# Patient Record
Sex: Male | Born: 1940 | Race: Black or African American | Hispanic: No | Marital: Married | Smoking: Light tobacco smoker
Health system: Southern US, Community
[De-identification: ages and names within clinical notes are randomized; demographics above are authoritative.]

## PROBLEM LIST (undated history)

## (undated) DIAGNOSIS — Z9289 Personal history of other medical treatment: Secondary | ICD-10-CM

## (undated) DIAGNOSIS — F32A Depression, unspecified: Secondary | ICD-10-CM

## (undated) DIAGNOSIS — F419 Anxiety disorder, unspecified: Secondary | ICD-10-CM

## (undated) DIAGNOSIS — I2510 Atherosclerotic heart disease of native coronary artery without angina pectoris: Secondary | ICD-10-CM

## (undated) DIAGNOSIS — Z8673 Personal history of transient ischemic attack (TIA), and cerebral infarction without residual deficits: Secondary | ICD-10-CM

## (undated) DIAGNOSIS — J45909 Unspecified asthma, uncomplicated: Secondary | ICD-10-CM

## (undated) DIAGNOSIS — F329 Major depressive disorder, single episode, unspecified: Secondary | ICD-10-CM

## (undated) DIAGNOSIS — I10 Essential (primary) hypertension: Secondary | ICD-10-CM

## (undated) DIAGNOSIS — I509 Heart failure, unspecified: Secondary | ICD-10-CM

## (undated) DIAGNOSIS — I219 Acute myocardial infarction, unspecified: Secondary | ICD-10-CM

## (undated) DIAGNOSIS — E785 Hyperlipidemia, unspecified: Secondary | ICD-10-CM

## (undated) DIAGNOSIS — M1990 Unspecified osteoarthritis, unspecified site: Secondary | ICD-10-CM

## (undated) HISTORY — DX: Essential (primary) hypertension: I10

## (undated) HISTORY — DX: Personal history of transient ischemic attack (TIA), and cerebral infarction without residual deficits: Z86.73

## (undated) HISTORY — DX: Personal history of other medical treatment: Z92.89

## (undated) HISTORY — DX: Unspecified osteoarthritis, unspecified site: M19.90

## (undated) HISTORY — DX: Hyperlipidemia, unspecified: E78.5

## (undated) HISTORY — DX: Heart failure, unspecified: I50.9

---

## 1998-11-09 ENCOUNTER — Emergency Department (HOSPITAL_COMMUNITY): Admission: EM | Admit: 1998-11-09 | Discharge: 1998-11-09 | Payer: Self-pay | Admitting: Emergency Medicine

## 1998-11-09 ENCOUNTER — Encounter: Payer: Self-pay | Admitting: Emergency Medicine

## 2001-11-22 ENCOUNTER — Emergency Department (HOSPITAL_COMMUNITY): Admission: EM | Admit: 2001-11-22 | Discharge: 2001-11-22 | Payer: Self-pay | Admitting: Emergency Medicine

## 2009-12-19 ENCOUNTER — Ambulatory Visit: Payer: Self-pay | Admitting: Internal Medicine

## 2009-12-19 LAB — CONVERTED CEMR LAB
ALT: 12 units/L (ref 0–53)
AST: 11 units/L (ref 0–37)
Albumin: 4.2 g/dL (ref 3.5–5.2)
Alkaline Phosphatase: 72 units/L (ref 39–117)
BUN: 11 mg/dL (ref 6–23)
CO2: 27 meq/L (ref 19–32)
Calcium: 9.6 mg/dL (ref 8.4–10.5)
Chloride: 105 meq/L (ref 96–112)
Cholesterol: 242 mg/dL — ABNORMAL HIGH (ref 0–200)
Creatinine, Ser: 0.99 mg/dL (ref 0.40–1.50)
Glucose, Bld: 90 mg/dL (ref 70–99)
HDL: 45 mg/dL (ref >39)
LDL Cholesterol: 176 mg/dL — ABNORMAL HIGH (ref 0–99)
Microalb, Ur: 0.88 mg/dL (ref 0.00–1.89)
PSA: 3.88 ng/mL (ref 0.10–4.00)
Potassium: 4.5 meq/L (ref 3.5–5.3)
Sodium: 141 meq/L (ref 135–145)
Total Bilirubin: 0.4 mg/dL (ref 0.3–1.2)
Total CHOL/HDL Ratio: 5.4
Total Protein: 6.9 g/dL (ref 6.0–8.3)
Triglycerides: 105 mg/dL (ref <150)
VLDL: 21 mg/dL (ref 0–40)

## 2010-02-13 ENCOUNTER — Ambulatory Visit: Payer: Self-pay | Admitting: Internal Medicine

## 2010-06-21 ENCOUNTER — Emergency Department (HOSPITAL_COMMUNITY)
Admission: EM | Admit: 2010-06-21 | Discharge: 2010-06-21 | Disposition: A | Discharge status: Home / Self Care | Payer: Medicare Other | Attending: Emergency Medicine | Admitting: Emergency Medicine

## 2010-06-21 DIAGNOSIS — Z79899 Other long term (current) drug therapy: Secondary | ICD-10-CM | POA: Insufficient documentation

## 2010-06-21 DIAGNOSIS — I10 Essential (primary) hypertension: Secondary | ICD-10-CM | POA: Insufficient documentation

## 2010-06-21 DIAGNOSIS — N390 Urinary tract infection, site not specified: Secondary | ICD-10-CM | POA: Insufficient documentation

## 2010-06-21 DIAGNOSIS — R319 Hematuria, unspecified: Secondary | ICD-10-CM | POA: Insufficient documentation

## 2010-06-21 LAB — URINALYSIS, ROUTINE W REFLEX MICROSCOPIC
Bilirubin Urine: NEGATIVE
Ketones, ur: NEGATIVE mg/dL
Nitrite: POSITIVE — AB
Protein, ur: 100 mg/dL — AB
Specific Gravity, Urine: 1.014 (ref 1.005–1.030)
Urine Glucose, Fasting: NEGATIVE mg/dL
Urobilinogen, UA: 1 mg/dL (ref 0.0–1.0)
pH: 6 (ref 5.0–8.0)

## 2010-06-21 LAB — POCT I-STAT, CHEM 8
BUN: 11 mg/dL (ref 6–23)
Calcium, Ion: 1.18 mmol/L (ref 1.12–1.32)
Chloride: 105 mEq/L (ref 96–112)
Creatinine, Ser: 1.3 mg/dL (ref 0.4–1.5)
Glucose, Bld: 99 mg/dL (ref 70–99)
HCT: 40 % (ref 39.0–52.0)
Hemoglobin: 13.6 g/dL (ref 13.0–17.0)
Potassium: 4.1 mEq/L (ref 3.5–5.1)
Sodium: 141 mEq/L (ref 135–145)
TCO2: 27 mmol/L (ref 0–100)

## 2010-06-21 LAB — URINE MICROSCOPIC-ADD ON

## 2010-07-14 ENCOUNTER — Ambulatory Visit (INDEPENDENT_AMBULATORY_CARE_PROVIDER_SITE_OTHER)
Admission: RE | Admit: 2010-07-14 | Discharge: 2010-07-14 | Disposition: A | Discharge status: Home / Self Care | Payer: No Typology Code available for payment source | Source: Ambulatory Visit | Attending: Internal Medicine | Admitting: Internal Medicine

## 2010-07-14 ENCOUNTER — Encounter: Payer: Self-pay | Admitting: Internal Medicine

## 2010-07-14 ENCOUNTER — Ambulatory Visit (INDEPENDENT_AMBULATORY_CARE_PROVIDER_SITE_OTHER): Payer: No Typology Code available for payment source | Admitting: Internal Medicine

## 2010-07-14 VITALS — BP 150/92 | HR 95 | Temp 97.7°F | Resp 14 | Ht 67.25 in | Wt 187.8 lb

## 2010-07-14 DIAGNOSIS — E785 Hyperlipidemia, unspecified: Secondary | ICD-10-CM

## 2010-07-14 DIAGNOSIS — R259 Unspecified abnormal involuntary movements: Secondary | ICD-10-CM

## 2010-07-14 DIAGNOSIS — M25551 Pain in right hip: Secondary | ICD-10-CM

## 2010-07-14 DIAGNOSIS — I10 Essential (primary) hypertension: Secondary | ICD-10-CM

## 2010-07-14 DIAGNOSIS — N390 Urinary tract infection, site not specified: Secondary | ICD-10-CM | POA: Insufficient documentation

## 2010-07-14 DIAGNOSIS — M25559 Pain in unspecified hip: Secondary | ICD-10-CM

## 2010-07-14 DIAGNOSIS — I2510 Atherosclerotic heart disease of native coronary artery without angina pectoris: Secondary | ICD-10-CM

## 2010-07-14 DIAGNOSIS — R251 Tremor, unspecified: Secondary | ICD-10-CM | POA: Insufficient documentation

## 2010-07-14 MED ORDER — LISINOPRIL-HYDROCHLOROTHIAZIDE 20-12.5 MG PO TABS: 1.0000 | ORAL_TABLET | Freq: Every day | ORAL | Status: DC

## 2010-07-14 MED ORDER — PRAVASTATIN SODIUM 80 MG PO TABS: 80.0000 mg | ORAL_TABLET | Freq: Every evening | ORAL | Status: DC

## 2010-07-14 NOTE — Patient Instructions (Signed)

## 2010-07-14 NOTE — Progress Notes (Signed)
Subjective:    Patient ID: Kenneth Daugherty, male    DOB: 1940/11/08, 71 y.o.   MRN: [id number]  Hypertension This is a chronic problem. The current episode started more than 1 year ago. The problem is unchanged. The problem is uncontrolled. Pertinent negatives include no anxiety, blurred vision, chest pain, headaches, malaise/fatigue, neck pain, orthopnea, palpitations, peripheral edema, PND, shortness of breath or sweats. There are no associated agents to hypertension. Risk factors for coronary artery disease include dyslipidemia. Past treatments include ACE inhibitors. The current treatment provides moderate improvement. There are no compliance problems.  There is no history of angina, kidney disease, CAD/MI, CVA, heart failure, left ventricular hypertrophy, PVD, retinopathy or a thyroid problem. There is no history of chronic renal disease.      Review of Systems  Constitutional: Negative for fever, chills, malaise/fatigue, diaphoresis, activity change, appetite change, fatigue and unexpected weight change.  HENT: Negative for neck pain and neck stiffness.   Eyes: Negative for blurred vision.  Respiratory: Negative for apnea, cough, choking, chest tightness, shortness of breath, wheezing and stridor.   Cardiovascular: Negative for chest pain, palpitations, orthopnea and PND.  Gastrointestinal: Negative for nausea, vomiting, abdominal pain, diarrhea, constipation, blood in stool and abdominal distention.  Genitourinary: Negative for dysuria, urgency, frequency, hematuria, flank pain, decreased urine volume, discharge, penile swelling, scrotal swelling and difficulty urinating (he tells me that he was treated in the ER recently for a UTI).  Musculoskeletal: Positive for arthralgias (he has had right hip pain for 6 months). Negative for myalgias, back pain, joint swelling and gait problem.  Neurological: Positive for tremors. Negative for dizziness, seizures, syncope, facial asymmetry, speech  difficulty, weakness, light-headedness, numbness and headaches.  Hematological: Negative for adenopathy. Does not bruise/bleed easily.  Psychiatric/Behavioral: Negative for suicidal ideas, hallucinations, behavioral problems, confusion, sleep disturbance, self-injury, dysphoric mood, decreased concentration and agitation. The patient is not nervous/anxious and is not hyperactive.       Objective:   Physical Exam  [nursing notereviewed. Constitutional: He appears well-developed and well-nourished. No distress.  HENT:  Head: Atraumatic.  Right Ear: External ear normal.  Left Ear: External ear normal.  Mouth/Throat: Oropharynx is clear and moist. No oropharyngeal exudate.  Eyes: Conjunctivae and EOM are normal. Pupils are equal, round, and reactive to light. Right eye exhibits no discharge. Left eye exhibits no discharge. No scleral icterus.  Neck: No thyromegaly present.  Cardiovascular: Normal rate, regular rhythm, normal heart sounds and intact distal pulses.  Exam reveals no gallop and no friction rub.   No murmur heard. Pulmonary/Chest: Effort normal. No respiratory distress. He has no wheezes. He has no rales. He exhibits no tenderness.  Abdominal: Soft. Bowel sounds are normal. He exhibits no distension. There is no tenderness. There is no rebound and no guarding.  Musculoskeletal: He exhibits no edema and no tenderness.  Neurological: He is alert. He has normal reflexes. He displays normal reflexes. No cranial nerve deficit. He exhibits normal muscle tone. Coordination normal.  Skin: Skin is warm and dry. No rash noted. He is not diaphoretic. No erythema. No pallor.  Psychiatric: He has a normal mood and affect. His behavior is normal. Judgment and thought content normal.        Lab Results  Component Value Date   HGB 13.6 06/21/2010   HCT 40.0 06/21/2010   CHOL 242* 12/19/2009   TRIG 105 12/19/2009   HDL 45 12/19/2009   ALT 12 8/41/3244   AST 11 12/19/2009   NA 141 06/21/2010  K  4.1 06/21/2010   CL 105 06/21/2010   CREATININE 1.3 06/21/2010   BUN 11 06/21/2010   CO2 27 12/19/2009   PSA 3.88 12/19/2009   MICROALBUR 0.88 12/19/2009   Lab Results  Component Value Date   CHOL 242* 12/19/2009   Lab Results  Component Value Date   HDL 45 12/19/2009   Lab Results  Component Value Date   LDLCALC 176* 12/19/2009   Lab Results  Component Value Date   TRIG 105 12/19/2009   Lab Results  Component Value Date   CHOLHDL 5.4 Ratio 12/19/2009   Assessment & Plan:

## 2010-07-15 ENCOUNTER — Encounter: Payer: Self-pay | Admitting: Internal Medicine

## 2013-02-24 ENCOUNTER — Emergency Department (HOSPITAL_COMMUNITY)
Admission: EM | Admit: 2013-02-24 | Discharge: 2013-02-25 | Disposition: A | Discharge status: Other Acute Inpatient Hospital | Payer: Medicare PPO | Source: Home / Self Care | Attending: Emergency Medicine | Admitting: Emergency Medicine

## 2013-02-24 ENCOUNTER — Encounter (HOSPITAL_COMMUNITY): Payer: Self-pay | Admitting: Emergency Medicine

## 2013-02-24 ENCOUNTER — Emergency Department (HOSPITAL_COMMUNITY): Payer: Medicare PPO

## 2013-02-24 DIAGNOSIS — N390 Urinary tract infection, site not specified: Secondary | ICD-10-CM

## 2013-02-24 DIAGNOSIS — I10 Essential (primary) hypertension: Secondary | ICD-10-CM

## 2013-02-24 DIAGNOSIS — F410 Panic disorder [episodic paroxysmal anxiety] without agoraphobia: Secondary | ICD-10-CM

## 2013-02-24 DIAGNOSIS — F329 Major depressive disorder, single episode, unspecified: Secondary | ICD-10-CM

## 2013-02-24 DIAGNOSIS — F32A Depression, unspecified: Secondary | ICD-10-CM

## 2013-02-24 DIAGNOSIS — R45851 Suicidal ideations: Secondary | ICD-10-CM

## 2013-02-24 DIAGNOSIS — F339 Major depressive disorder, recurrent, unspecified: Secondary | ICD-10-CM | POA: Diagnosis not present

## 2013-02-24 LAB — URINALYSIS, ROUTINE W REFLEX MICROSCOPIC
Bilirubin Urine: NEGATIVE
Glucose, UA: NEGATIVE mg/dL
Ketones, ur: NEGATIVE mg/dL
Nitrite: POSITIVE — AB
Specific Gravity, Urine: 1.011 (ref 1.005–1.030)
Urobilinogen, UA: 0.2 mg/dL (ref 0.0–1.0)
pH: 6 (ref 5.0–8.0)

## 2013-02-24 LAB — URINE MICROSCOPIC-ADD ON

## 2013-02-24 LAB — CBC
Hemoglobin: 14.4 g/dL (ref 13.0–17.0)
MCH: 28.6 pg (ref 26.0–34.0)
Platelets: 208 10*3/uL (ref 150–400)
RBC: 5.03 MIL/uL (ref 4.22–5.81)
WBC: 4.5 10*3/uL (ref 4.0–10.5)

## 2013-02-24 LAB — COMPREHENSIVE METABOLIC PANEL
ALT: 7 U/L (ref 0–53)
AST: 12 U/L (ref 0–37)
Albumin: 3.8 g/dL (ref 3.5–5.2)
CO2: 28 mEq/L (ref 19–32)
Calcium: 9.8 mg/dL (ref 8.4–10.5)
Chloride: 104 mEq/L (ref 96–112)
Creatinine, Ser: 0.98 mg/dL (ref 0.50–1.35)
GFR calc non Af Amer: 79 mL/min — ABNORMAL LOW (ref >90)
Potassium: 4.2 mEq/L (ref 3.5–5.1)
Sodium: 143 mEq/L (ref 135–145)
Total Bilirubin: 0.4 mg/dL (ref 0.3–1.2)
Total Protein: 7.2 g/dL (ref 6.0–8.3)

## 2013-02-24 LAB — POCT I-STAT TROPONIN I
Troponin i, poc: 0.03 ng/mL (ref 0.00–0.08)
Troponin i, poc: 0.02 ng/mL (ref 0.00–0.08)

## 2013-02-24 LAB — RAPID URINE DRUG SCREEN, HOSP PERFORMED
Amphetamines: NOT DETECTED
Barbiturates: NOT DETECTED
Benzodiazepines: NOT DETECTED
Cocaine: NOT DETECTED
Opiates: NOT DETECTED
Tetrahydrocannabinol: NOT DETECTED

## 2013-02-24 MED ORDER — NITROFURANTOIN MONOHYD MACRO 100 MG PO CAPS
100.0000 mg | ORAL_CAPSULE | Freq: Once | ORAL | Status: AC
  Administered 2013-02-24: 100 mg via ORAL
  Filled 2013-02-24: qty 1

## 2013-02-24 MED ORDER — ASPIRIN 81 MG PO CHEW
324.0000 mg | CHEWABLE_TABLET | Freq: Once | ORAL | Status: AC
  Administered 2013-02-24: 324 mg via ORAL
  Filled 2013-02-24: qty 4

## 2013-02-24 MED ORDER — NITROFURANTOIN MONOHYD MACRO 100 MG PO CAPS: 100.0000 mg | ORAL_CAPSULE | Freq: Two times a day (BID) | ORAL | Status: DC

## 2013-02-24 MED ORDER — LISINOPRIL 10 MG PO TABS: 10.0000 mg | ORAL_TABLET | Freq: Every day | ORAL | Status: DC

## 2013-02-24 MED ORDER — LISINOPRIL 10 MG PO TABS
10.0000 mg | ORAL_TABLET | Freq: Every day | ORAL | Status: DC
  Administered 2013-02-24: 10 mg via ORAL
  Filled 2013-02-24: qty 1

## 2013-02-24 NOTE — ED Notes (Signed)
Pt continues to sleep.  Awaiting transfer to [facility].

## 2013-02-24 NOTE — ED Notes (Signed)
Pt ate most of dinner.  Reports he is feeling better, laying on bed resting.  Warm blankets given.

## 2013-02-24 NOTE — BH Assessment (Signed)
Pt was asked to sign a Voluntary Admission and Consent for Treatment form as well as a Consent to Release Information form. MHT explained the purpose of the forms before Pt signed them. Pt stated that he had no one to release information to but signed the form with "none" written in the fields. Pt's nurse was notified.  -Dossie Arbour, MA  Mental Health Tech

## 2013-02-24 NOTE — BH Assessment (Signed)
[facility] Assessment Progress Note   Pt seen by tele-psychiatry by Verne Spurr, PA.  She determines that pt presents a life threatening danger to self, for which psychiatric hospitalization is indicated.  Pt accepted to [facility] to the service of Thedore Mins, MD, Rm [id number].  At 16:27 I attempted to notify EDP Dr. Elesa Massed, who was not available.  At 16:38 I spoke to [place], Charity fundraiser, the pt's nurse, and she reports that Dr Elesa Massed is already aware that the pt has been accepted.  I then gave Dorathy Daft the details noted above.  Dossie Arbour, MHT was then notified.  He agrees to have pt sign appropriate paperwork.  Doylene Canning, MA Triage Specialist 02/24/2013 @ 4:41 PM

## 2013-02-24 NOTE — BH Assessment (Signed)
Tele Assessment Note   Kenneth Daugherty is an 72 y.o. male. Writer called and spoke w/ Dr. Elesa Massed re: TTS consult. Ward states pt is currently voluntary and Ward mentions several risk factors including 72 yo male w/ SI, poor support system & guns in his home. Pt presents voluntarily to [facility] accompanied by his grandson, Kenneth Daugherty, w/ whom pt lives. Pt is cooperative and polite. Pt describes his mood as "a little depressed". He endorses poor appetite and fatigue. Pt endorses SI "once in a while" when his family and friends "aggravate me". He currently denies SI. Pt says that while his grandson is at work, relatives and friends are constantly in his house asking for $ or to borrow pt's car. Kenneth Daugherty sts that these same people who ask pt for favors never visited house prior to wife's death. Kenneth Daugherty sts that he works 15 hrs a day and while he is at work the relatives and friends "use" pt by asking for favors. Pt sts he hasn't been sleeping well and only sleeps approx. 1 hr per night. Pt sts he called EMS today because became "real hot" and nervous. Pt says he started to stagger and began shaking. Pt sts this hasn't happened to him prior to today. Pt states he had been thinking about his wife when the episode began. Pt sts he can keep himself safe if d/c. Pt sts he owns two guns which are in the house. Kenneth Daugherty says he has spoken w/ his mother and mother plans to remove pt's guns and keep them at [employer] house when she gets out of school this afternoon. Pt denies HI and [facility]. No delusions noted. Pt denies wanting to harm anyone in particular but does say he wants to harm people sometimes when "they get on my nerves". He says he is referring to the people who want to borrow his $ and his car. Pt has no hx of inpt or outpatient MH treatment. Pt denies hx of substance use or abuse. Pt says he and wife moved down to [place] from [place] 4 yrs ago.  Writer spoke w/ Dr. Elesa Massed after teleassessment and Dr. Elesa Massed would like a [facility]  extender to see pt also.   Axis I: Major Depressive NOS Axis II: Deferred Axis III:  Past Medical History  Diagnosis Date  . Hyperlipidemia   . Hypertension   . UTI (lower urinary tract infection)    Axis IV: other psychosocial or environmental problems, problems related to social environment and problems with primary support group Axis V: 41-50 serious symptoms  Past Medical History:  Past Medical History  Diagnosis Date  . Hyperlipidemia   . Hypertension   . UTI (lower urinary tract infection)     History reviewed. No pertinent past surgical history.  Family History:  Family History  Problem Relation Age of Onset  . Cancer Neg Hx   . Heart disease Neg Hx   . Hypertension Mother   . Hyperlipidemia Father     Social History:  reports that he has been smoking.  He does not have any smokeless tobacco history on file. He reports that he does not drink alcohol or use illicit drugs.  Additional Social History:  Alcohol / Drug Use Pain Medications: n/a Prescriptions: n/a Over the Counter: see PTA meds list History of alcohol / drug use?: No history of alcohol / drug abuse  CIWA: CIWA-Ar BP: 177/101 mmHg Pulse Rate: 88 COWS:    Allergies: No Known Allergies  Home Medications:  (Not in  a hospital admission)  OB/GYN Status:  No LMP for male patient.  General Assessment Data Location of Assessment: [facility] ED Is this a Tele or Face-to-Face Assessment?: Tele Assessment Is this an Initial Assessment or a Re-assessment for this encounter?: Initial Assessment Living Arrangements: Other relatives (grandson) Can pt return to current living arrangement?: Yes Admission Status: Voluntary Is patient capable of signing voluntary admission?: Yes Transfer from: Home Referral Source: Self/Family/Friend     [facility] Crisis Care Plan Living Arrangements: Other relatives (grandson)  Education Status Is patient currently in school?: No Current Grade: na Highest grade of school  patient has completed: 7  Risk to self Suicidal Ideation: Yes-Currently Present Suicidal Intent: No Is patient at risk for suicide?: Yes Suicidal Plan?: No Access to Means: Yes Specify Access to Suicidal Means: firearms in house What has been your use of drugs/alcohol within the last 12 months?: none Previous Attempts/Gestures: No How many times?: 0 Other Self Harm Risks: none Triggers for Past Attempts:  (n/a) Intentional Self Injurious Behavior: None Family Suicide History: No Recent stressful life event(s): Loss (Comment) (wife died 1 month ago, people asking pt for favors) Persecutory voices/beliefs?: No Depression: Yes Depression Symptoms: Fatigue;Insomnia Substance abuse history and/or treatment for substance abuse?: No Suicide prevention information given to non-admitted patients: Not applicable  Risk to Others Homicidal Ideation: No Thoughts of Harm to Others: No Current Homicidal Intent: No Current Homicidal Plan: No Access to Homicidal Means: No Identified Victim: na History of harm to others?: No Assessment of Violence: None Noted Violent Behavior Description: pt calm and cooperative Does patient have access to weapons?: No Criminal Charges Pending?: No Does patient have a court date: No  Psychosis Hallucinations: None noted Delusions: None noted  Mental Status Report Appear/Hygiene: Other (Comment) (appropriate) Eye Contact: Good Motor Activity: Freedom of movement Speech: Logical/coherent Level of Consciousness: Alert Mood: Depressed Affect: Appropriate to circumstance Anxiety Level: None Thought Processes: Coherent;Relevant Judgement: Unimpaired Orientation: Person;Place;Time;Situation Obsessive Compulsive Thoughts/Behaviors: None  Cognitive Functioning Concentration: Normal Memory: Recent Intact;Remote Intact IQ: Average Insight: Fair Impulse Control: Good Appetite: Poor Weight Loss:  (pt denies weight loss) Sleep: Decreased Total Hours  of Sleep: 1 Vegetative Symptoms: None  ADLScreening [facility] Assessment Services) Patient's cognitive ability adequate to safely complete daily activities?: Yes Patient able to express need for assistance with ADLs?: Yes Independently performs ADLs?: Yes (appropriate for developmental age)  Prior Inpatient Therapy Prior Inpatient Therapy: No Prior Therapy Dates: na Prior Therapy Facilty/Provider(s): na Reason for Treatment: na  Prior Outpatient Therapy Prior Outpatient Therapy: No Prior Therapy Dates: na Prior Therapy Facilty/Provider(s): na Reason for Treatment: na  ADL Screening (condition at time of admission) Patient's cognitive ability adequate to safely complete daily activities?: Yes Is the patient deaf or have difficulty hearing?: No Does the patient have difficulty seeing, even when wearing glasses/contacts?: No Does the patient have difficulty concentrating, remembering, or making decisions?: No Patient able to express need for assistance with ADLs?: Yes Does the patient have difficulty dressing or bathing?: No Independently performs ADLs?: Yes (appropriate for developmental age) Does the patient have difficulty walking or climbing stairs?: No Weakness of Legs: None Weakness of Arms/Hands: None  Home Assistive Devices/Equipment Home Assistive Devices/Equipment: Eyeglasses    Abuse/Neglect Assessment (Assessment to be complete while patient is alone) Physical Abuse: Denies Verbal Abuse: Denies Sexual Abuse: Denies Exploitation of patient/patient's resources: Denies Self-Neglect: Denies     Merchant navy officer (For Healthcare) Advance Directive: Patient does not have advance directive;Patient would not like information  Additional Information 1:1 In Past 12 Months?: No CIRT Risk: No Elopement Risk: No Does patient have medical clearance?: No     Disposition:  Disposition Initial Assessment Completed for this Encounter: Yes Disposition of Patient: Other  dispositions Other disposition(s): Other (Comment) (telepsych ordered by EDP Ward)  Lekeisha Arenas P 02/24/2013 3:02 PM

## 2013-02-24 NOTE — ED Provider Notes (Signed)
TIME SEEN: 1:01 PM  CHIEF COMPLAINT: "I just became really hot and started breathing fast"  HPI: Patient is a 72 y.o. male with a history of hypertension, hyperlipidemia, tobacco use who presents emergency department with an episode of becoming hot and hyperventilating at home today. He reports his wife passed away a month ago and he has been very upset recently over her death. He states today when he was thinking about her he became very hot, flushed and began breathing fast. He denies any chest pain, chest discomfort, shortness of breath, diaphoresis, nausea or vomiting. Currently, he states that he is feeling well but has a headache that is diffuse and throbbing. He states he had similar headaches daily. No numbness, tingling or focal weakness. No fevers, neck pain or neck stiffness. When asked about suicidality, patient reports that he has been having suicidal thoughts for the past month intermittently. None currently. No plan. He does have multiple firearms in the house. He lives with his grandson.  PCP is Dr. Yetta Barre but he states he has not seen him in over a year  ROS: See HPI Constitutional: no fever  Eyes: no drainage  ENT: no runny nose   Cardiovascular:  no chest pain  Resp: no SOB  GI: no vomiting GU: no dysuria Integumentary: no rash  Allergy: no hives  Musculoskeletal: no leg swelling  Neurological: no slurred speech ROS otherwise negative  PAST MEDICAL HISTORY/PAST SURGICAL HISTORY:  Past Medical History  Diagnosis Date  . Hyperlipidemia   . Hypertension   . UTI (lower urinary tract infection)     MEDICATIONS:  Prior to Admission medications   Medication Sig Start Date End Date Taking? Authorizing Provider  aspirin 81 MG tablet Take 81 mg by mouth as needed. For pain    Yes Historical Provider, MD    ALLERGIES:  No Known Allergies  SOCIAL HISTORY:  History  Substance Use Topics  . Smoking status: Current Every Day Smoker -- 0.50 packs/day for 50 years  .  Smokeless tobacco: Not on file  . Alcohol Use: No    FAMILY HISTORY: Family History  Problem Relation Age of Onset  . Cancer Neg Hx   . Heart disease Neg Hx   . Hypertension Mother   . Hyperlipidemia Father     EXAM: BP 175/102  Pulse 86  Temp(Src) 98.6 F (37 C) (Oral)  Resp 18  SpO2 99% CONSTITUTIONAL: Alert and oriented and responds appropriately to questions. Well-appearing; well-nourished HEAD: Normocephalic EYES: Conjunctivae clear, PERRL ENT: normal nose; no rhinorrhea; moist mucous membranes; pharynx without lesions noted NECK: Supple, no meningismus, no LAD  CARD: RRR; S1 and S2 appreciated; no murmurs, no clicks, no rubs, no gallops RESP: Normal chest excursion without splinting or tachypnea; breath sounds clear and equal bilaterally; no wheezes, no rhonchi, no rales,  ABD/GI: Normal bowel sounds; non-distended; soft, non-tender, no rebound, no guarding BACK:  The back appears normal and is non-tender to palpation, there is no CVA tenderness EXT: Normal ROM in all joints; non-tender to palpation; no edema; normal capillary refill; no cyanosis    SKIN: Normal color for age and race; warm NEURO: Moves all extremities equally; no facial droop or slurred speech PSYCH: Patient is tearful, reports intermittent suicidality, no homicidal ideation, delusions or hallucinations  MEDICAL DECISION MAKING: Patient with episode of flushing and hyperventilation and likely secondary to panic attack. Given his age and risk factors however, we'll obtain cardiac labs, chest x-ray and EKG. Will give aspirin. I do not  feel he needs any head imaging at this time for his chronic headache. Given he is an elderly male with access to firearms and reports suicidality, we'll discuss with TTS for evaluation. Patient agrees to stay in the emergency department voluntarily. His grandson at bedside agrees with this plan.  ED PROGRESS: EKG shows nonspecific ST changes. First troponin is negative. Chest  x-ray clear. Patient is still hemodynamically stable and asymptomatic. Will repeat serial troponins. Have spoke with Idalia Needle with ACT who will evaluate with telepsych and then PA will evaluate.  Family and pt updated.   Spoke with [place], PA with psych.  They will accept the patient given that he is high risk and family reports this is the worst the patient has ever been. Repeat troponin at 4 PM and 7 PM pending. Will start lisinopril for his hypertension. He states he's been off medication for 3 weeks.  Repeat troponin at 4 PM negative. Patient still well-appearing, no complaints. Agrees to admission for depression. Agrees to come in voluntarily.  Third troponin at 8 PM negative. Patient has no complaints. Will transferred to behavioral health. We'll discharge with prescription for lisinopril for his hypertension and Macrobid for his UTI. Given dose of Macrobid in the ED. Urine culture pending.   EKG Interpretation     Ventricular Rate:  86 PR Interval:  124 QRS Duration: 76 QT Interval:  335 QTC Calculation: 401 R Axis:   48 Text Interpretation:  Sinus rhythm Atrial premature complexes Probable left atrial enlargement Baseline wander in lead(s) III              Layla Maw Williamson [place], DO 02/24/13 2037

## 2013-02-24 NOTE — Consult Note (Signed)
Telepsych Consultation   Reason for Consult:  Depression with grief Referring Physician:  EDMD  Kenneth Daugherty is an 72 y.o. male.  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 Hyperlipidemia   . Hypertension   . UTI (lower urinary tract infection)    AXIS IV:  problems related to social environment and problems with primary support group AXIS V:  41-50 serious symptoms  Plan:  Recommend psychiatric Inpatient admission when medically cleared.  Subjective:   Kenneth Daugherty is a 72 y.o. male patient admitted with SOB, diaphoresis and anxiety this AM. Current testing does not reveal medical reason. Kenneth Daugherty states that he lost his wife of 45 years about a month ago after a protracted illness. He reports he has always had a history of depression and prefers to be alone. He was in special classes as a child because he had a tendency to hit others when he was angry. He dropped out in the 6th grade.      He denies any previous suicide attempts but states he will hit walls when he is depressed. He states this is the worst it has ever been. He states his anxiety is the worst it has ever been. He also endorses poor sleep, poor appetite, irritable moods, increased isolation, anhedonia with crying spells, worsening grief, poor tolerance for delayed gratification.  Kenneth Daugherty also endorses suicidal ideation with no specific plan and this too is new for him. He does have access to fire arms.       His niece is present with him and also states she too is very concerned. He did give consent for her to be present during the interview.          Kenneth Daugherty is in need of acute psychiatric hospitalization and stabilization for his MDD recurrent with complicated grief.  HPI Elements:   Location:  ED. Quality:  Chronic. Severity:  worst it has ever been. Timing:   worsening over the past 3 weeks since the death of his wife of 45 years. Duration:  years. Context:  Patient endorses suicidal ideation, poor sleep, poor appetite, and does not feel safe..  Past Psychiatric History: Past Medical History  Diagnosis Date  . Hyperlipidemia   . Hypertension   . UTI (lower urinary tract infection)     reports that he has been smoking.  He does not have any smokeless tobacco history on file. He reports that he does not drink alcohol or use illicit drugs. Family History  Problem Relation Age of Onset  . Cancer Neg Hx   . Heart disease Neg Hx   . Hypertension Mother   . Hyperlipidemia Father    Family History Substance Abuse: No Family Supports: Yes, List: (grandson and daughter) Living Arrangements: Other relatives (grandson) Can pt return to current living arrangement?: Yes Allergies:  No Known Allergies  ACT Assessment Complete:  Yes:    Educational Status    Risk to Self: Risk to self Suicidal Ideation: Yes-Currently Present Suicidal Intent: No Is patient at risk for suicide?: Yes Suicidal Plan?: No Access to Means: Yes Specify Access to Suicidal Means: firearms in house What has been your use of drugs/alcohol within the last 12 months?: none Previous Attempts/Gestures: No How many times?: 0 Other Self Harm Risks: none Triggers for Past Attempts:  (n/a) Intentional Self Injurious Behavior: None Family Suicide History: No  Recent stressful life event(s): Loss (Comment) (wife died 1 month ago, people asking pt for favors) Persecutory voices/beliefs?: No Depression: Yes Depression Symptoms: Fatigue;Insomnia Substance abuse history and/or treatment for substance abuse?: No Suicide prevention information given to non-admitted patients: Not applicable  Risk to Others: Risk to Others Homicidal Ideation: No Thoughts of Harm to Others: No Current Homicidal Intent: No Current Homicidal Plan: No Access to Homicidal Means: No Identified Victim:  na History of harm to others?: No Assessment of Violence: None Noted Violent Behavior Description: pt calm and cooperative Does patient have access to weapons?: No Criminal Charges Pending?: No Does patient have a court date: No  Abuse: Abuse/Neglect Assessment (Assessment to be complete while patient is alone) Physical Abuse: Denies Verbal Abuse: Denies Sexual Abuse: Denies Exploitation of patient/patient's resources: Denies Self-Neglect: Denies  Prior Inpatient Therapy: Prior Inpatient Therapy Prior Inpatient Therapy: No Prior Therapy Dates: na Prior Therapy Facilty/Provider(s): na Reason for Treatment: na  Prior Outpatient Therapy: Prior Outpatient Therapy Prior Outpatient Therapy: No Prior Therapy Dates: na Prior Therapy Facilty/Provider(s): na Reason for Treatment: na  Additional Information: Additional Information 1:1 In Past 12 Months?: No CIRT Risk: No Elopement Risk: No Does patient have medical clearance?: No                  Objective: Blood pressure 177/101, pulse 88, temperature 98.6 F (37 C), temperature source Oral, resp. rate 31, SpO2 97.00%.There is no weight on file to calculate BMI. Results for orders placed during the hospital encounter of 02/24/13 (from the past 72 hour(s))  CBC     Status: None   Collection Time    02/24/13 12:52 PM      Result Value Range   WBC 4.5  4.0 - 10.5 K/uL   RBC 5.03  4.22 - 5.81 MIL/uL   Hemoglobin 14.4  13.0 - 17.0 g/dL   HCT 40.9  81.1 - 91.4 %   MCV 83.9  78.0 - 100.0 fL   MCH 28.6  26.0 - 34.0 pg   MCHC 34.1  30.0 - 36.0 g/dL   RDW 78.2  95.6 - 21.3 %   Platelets 208  150 - 400 K/uL  COMPREHENSIVE METABOLIC PANEL     Status: Abnormal   Collection Time    02/24/13 12:52 PM      Result Value Range   Sodium 143  135 - 145 mEq/L   Potassium 4.2  3.5 - 5.1 mEq/L   Chloride 104  96 - 112 mEq/L   CO2 28  19 - 32 mEq/L   Glucose, Bld 108 (*) 70 - 99 mg/dL   BUN 9  6 - 23 mg/dL   Creatinine, Ser 0.86   0.50 - 1.35 mg/dL   Calcium 9.8  8.4 - 57.8 mg/dL   Total Protein 7.2  6.0 - 8.3 g/dL   Albumin 3.8  3.5 - 5.2 g/dL   AST 12  0 - 37 U/L   ALT 7  0 - 53 U/L   Alkaline Phosphatase 70  39 - 117 U/L   Total Bilirubin 0.4  0.3 - 1.2 mg/dL   GFR calc non Af Amer 79 (*) >90 mL/min   GFR calc Af Amer >90  >90 mL/min   Comment: (NOTE)     The eGFR has been calculated using the CKD EPI equation.     This calculation has not been validated in all clinical situations.     eGFR's persistently <90 mL/min signify possible Chronic Kidney  Disease.  POCT I-STAT TROPONIN I     Status: None   Collection Time    02/24/13  1:05 PM      Result Value Range   Troponin i, poc 0.00  0.00 - 0.08 ng/mL   Comment 3            Comment: Due to the release kinetics of cTnI,     a negative result within the first hours     of the onset of symptoms does not rule out     myocardial infarction with certainty.     If myocardial infarction is still suspected,     repeat the test at appropriate intervals.   Labs are reviewed and are pertinent for .  No current facility-administered medications for this encounter.   Current Outpatient Prescriptions  Medication Sig Dispense Refill  . aspirin 81 MG tablet Take 81 mg by mouth as needed. For pain         Psychiatric Specialty Exam:     Blood pressure 177/101, pulse 88, temperature 98.6 F (37 C), temperature source Oral, resp. rate 31, SpO2 97.00%.There is no weight on file to calculate BMI.  General Appearance: Fairly Groomed  Patent attorney::  Poor  Speech:  Slow  Volume:  Normal  Mood:  Depressed  Affect:  Tearful  Thought Process:  Goal Directed  Orientation:  Full (Time, Place, and Person)  Thought Content:  WDL  Suicidal Thoughts:  Yes.  without intent/plan  Homicidal Thoughts:  Yes.  without intent/plan  Memory:  NA  Judgement:  Intact  Insight:  Present  Psychomotor Activity:  Decreased  Concentration:  Poor  Recall:  Fair  Akathisia:  No   Handed:  Right  AIMS (if indicated):     Assets:  Communication Skills Desire for Improvement  Sleep:      Treatment Plan Summary: 1. Admit for crisis management and stabilization. 2. Medication management to reduce current symptoms to base line and improve the patient's overall level of functioning. 3. Treat health problems as indicated. 4. Develop treatment plan to decrease risk of relapse upon discharge and to reduce the need for readmission. 5. Psycho-social education regarding relapse prevention and self care. 6. Health care follow up as needed for medical problems. 7. Restart home medications where appropriate.  Disposition: 1. Will admit to [facility] for stabilization and crisis management. 2. Will notify MD at ED of plans for admission and orders. 3. AC will be notified of bed placement needs. 4. Thank you for allowing me to participate in the care of this very nice man.   Rona Ravens. [place] RPAC 3:59 PM 02/24/2013   Reviewed the information documented and agree with the treatment plan.  Genise Strack,[name] 02/24/2013 4:48 PM

## 2013-02-24 NOTE — ED Notes (Signed)
Pt from home by EMS for eval of anxiety.  Pt's wife passed away about 1 month ago.  Pt also hypertensive.  BP 196/118. No allergies. Takes lisinopril for HTN. CBG 99.  HR 106 ST. RR 22. 98% on room air. Lungs clear.

## 2013-02-25 ENCOUNTER — Inpatient Hospital Stay (HOSPITAL_COMMUNITY)
Admission: AD | Admit: 2013-02-25 | Discharge: 2013-02-27 | DRG: 885 | Disposition: A | Discharge status: Home / Self Care | Payer: Medicare PPO | Source: Intra-hospital | Attending: Psychiatry | Admitting: Psychiatry

## 2013-02-25 ENCOUNTER — Encounter (HOSPITAL_COMMUNITY): Payer: Self-pay | Admitting: Behavioral Health

## 2013-02-25 DIAGNOSIS — R251 Tremor, unspecified: Secondary | ICD-10-CM

## 2013-02-25 DIAGNOSIS — F339 Major depressive disorder, recurrent, unspecified: Principal | ICD-10-CM | POA: Diagnosis present

## 2013-02-25 DIAGNOSIS — Z634 Disappearance and death of family member: Secondary | ICD-10-CM

## 2013-02-25 DIAGNOSIS — Z7982 Long term (current) use of aspirin: Secondary | ICD-10-CM

## 2013-02-25 DIAGNOSIS — I10 Essential (primary) hypertension: Secondary | ICD-10-CM

## 2013-02-25 DIAGNOSIS — R45851 Suicidal ideations: Secondary | ICD-10-CM | POA: Diagnosis not present

## 2013-02-25 DIAGNOSIS — N390 Urinary tract infection, site not specified: Secondary | ICD-10-CM | POA: Diagnosis present

## 2013-02-25 DIAGNOSIS — F602 Antisocial personality disorder: Secondary | ICD-10-CM | POA: Diagnosis present

## 2013-02-25 DIAGNOSIS — F322 Major depressive disorder, single episode, severe without psychotic features: Secondary | ICD-10-CM | POA: Diagnosis present

## 2013-02-25 DIAGNOSIS — E785 Hyperlipidemia, unspecified: Secondary | ICD-10-CM

## 2013-02-25 DIAGNOSIS — M25551 Pain in right hip: Secondary | ICD-10-CM

## 2013-02-25 DIAGNOSIS — F332 Major depressive disorder, recurrent severe without psychotic features: Secondary | ICD-10-CM

## 2013-02-25 MED ORDER — FLUOXETINE HCL 10 MG PO CAPS
10.0000 mg | ORAL_CAPSULE | Freq: Every day | ORAL | Status: DC
  Administered 2013-02-25 – 2013-02-27 (×3): 10 mg via ORAL
  Filled 2013-02-25 (×3): qty 1
  Filled 2013-02-25: qty 7
  Filled 2013-02-25 (×3): qty 1

## 2013-02-25 MED ORDER — MAGNESIUM HYDROXIDE 400 MG/5ML PO SUSP: 30.0000 mL | Freq: Every day | ORAL | Status: DC | PRN

## 2013-02-25 MED ORDER — NITROFURANTOIN MONOHYD MACRO 100 MG PO CAPS
100.0000 mg | ORAL_CAPSULE | Freq: Two times a day (BID) | ORAL | Status: DC
  Administered 2013-02-25 – 2013-02-27 (×4): 100 mg via ORAL
  Filled 2013-02-25 (×9): qty 1

## 2013-02-25 MED ORDER — ALUM & MAG HYDROXIDE-SIMETH 200-200-20 MG/5ML PO SUSP: 30.0000 mL | ORAL | Status: DC | PRN

## 2013-02-25 MED ORDER — TRAZODONE HCL 50 MG PO TABS
50.0000 mg | ORAL_TABLET | Freq: Every day | ORAL | Status: DC
  Administered 2013-02-25: 50 mg via ORAL
  Filled 2013-02-25 (×2): qty 1
  Filled 2013-02-25: qty 7
  Filled 2013-02-25: qty 1

## 2013-02-25 MED ORDER — ALUM & MAG HYDROXIDE-SIMETH 200-200-20 MG/5ML PO SUSP
30.0000 mL | Freq: Once | ORAL | Status: AC
  Administered 2013-02-25: 30 mL via ORAL
  Filled 2013-02-25: qty 30

## 2013-02-25 MED ORDER — CEPHALEXIN 250 MG PO CAPS
500.0000 mg | ORAL_CAPSULE | Freq: Three times a day (TID) | ORAL | Status: DC
  Administered 2013-02-25: 500 mg via ORAL
  Filled 2013-02-25: qty 2

## 2013-02-25 MED ORDER — ACETAMINOPHEN 325 MG PO TABS: 650.0000 mg | ORAL_TABLET | Freq: Four times a day (QID) | ORAL | Status: DC | PRN

## 2013-02-25 MED ORDER — LISINOPRIL 10 MG PO TABS
10.0000 mg | ORAL_TABLET | Freq: Every day | ORAL | Status: DC
  Administered 2013-02-25 – 2013-02-27 (×3): 10 mg via ORAL
  Filled 2013-02-25 (×5): qty 1

## 2013-02-25 NOTE — Tx Team (Signed)
Initial Interdisciplinary Treatment Plan  PATIENT STRENGTHS: (choose at least two) Ability for insight Capable of independent living General fund of knowledge Motivation for treatment/growth  PATIENT STRESSORS: Loss of  relative Traumatic event   PROBLEM LIST: Problem List/Patient Goals Date to be addressed Date deferred Reason deferred Estimated date of resolution  Depression                                                       DISCHARGE CRITERIA:  Ability to meet basic life and health needs Improved stabilization in mood, thinking, and/or behavior Motivation to continue treatment in a less acute level of care  PRELIMINARY DISCHARGE PLAN: Outpatient therapy Return to previous living arrangement  PATIENT/FAMIILY INVOLVEMENT: This treatment plan has been presented to and reviewed with the patient, Kenneth Daugherty.  The patient and family have been given the opportunity to ask questions and make suggestions.  Harold Barban E 02/25/2013, 11:01 AM

## 2013-02-25 NOTE — ED Notes (Signed)
Attempted to call report they stated no RN available to take report.

## 2013-02-25 NOTE — [facility] Group Notes (Signed)
[facility] LCSW Group Therapy  02/25/2013  1:15 PM   Type of Therapy:  Group Therapy  Participation Level:  Did Not Attend  Reyes Ivan, LCSW 02/25/2013 2:31 PM

## 2013-02-25 NOTE — ED Notes (Addendum)
Spoke to [facility]  At  [facility] stated "Pt can transfer after 0700"

## 2013-02-25 NOTE — BH Assessment (Signed)
Patient's family called screaming on the phone and extremely irrate. They were demanding to know where Kenneth Daugherty was at stating, "We don't know where he is".   Sts that they know he was in the ER yesterday and have not heard from him. Writer informed family that he is not in the ED anymore. Writer explained that due to HIPPA no further details could be given regarding his dispositions, discharge, whereabouts, etc.    Family then asked if patient was here at [facility]. Writer informed family that if patient is here in this facility it could not be confirmed or denied without a code #. Writer explained to family that they must have a code #, or consent must be granted, or guardianship papers in order to provide info related to this patient. Writer tried to suggest that family leaves a msg and if patient is here he would be given the message.   Patient's (male) family member called this Clinical research associate a "BITCH" and hung up the phone.

## 2013-02-25 NOTE — [facility] Suicide Risk Assessment (Signed)
Suicide Risk Assessment  Admission Assessment     Nursing information obtained from:    Demographic factors:    Current Mental Status:    Loss Factors:    Historical Factors:    Risk Reduction Factors:     CLINICAL FACTORS:   Severe Anxiety and/or Agitation Depression:   Anhedonia Hopelessness Impulsivity Insomnia Recent sense of peace/wellbeing Severe Previous Psychiatric Diagnoses and Treatments Medical Diagnoses and Treatments/Surgeries  COGNITIVE FEATURES THAT CONTRIBUTE TO RISK:  Closed-mindedness Loss of executive function Polarized thinking Thought constriction (tunnel vision)    SUICIDE RISK:   Moderate:  Frequent suicidal ideation with limited intensity, and duration, some specificity in terms of plans, no associated intent, good self-control, limited dysphoria/symptomatology, some risk factors present, and identifiable protective factors, including available and accessible social support.  PLAN OF CARE: Patient admitted voluntarily and emergently from [facility] long emergency department for depression with suicidal ideation and unable to contract for safety. Patient will be receiving crisis stabilization, safety monitoring and medication management.  I certify that inpatient services furnished can reasonably be expected to improve the patient's condition.  [name],[name] 02/25/2013, 2:40 PM

## 2013-02-25 NOTE — Progress Notes (Signed)
Patient ID: Kenneth Daugherty, male   DOB: Feb 08, 1941, 72 y.o.   MRN: [id number] PER STATE REGULATIONS 482.30  THIS CHART WAS REVIEWED FOR MEDICAL NECESSITY WITH RESPECT TO THE PATIENT'S ADMISSION/ DURATION OF STAY.  NEXT REVIEW DATE: 03/01/2013  Willa Rough, RN, BSN CASE MANAGER

## 2013-02-25 NOTE — Progress Notes (Signed)
D: Pt mood is depressed. Pt rates his depression a 1/10. Pt has been going to groups and interacts well within the milieu.  A: Support given. Verbalization encouraged. Pt encouraged to come to nurse with any concerns. Will continue to monitor pt. R: Pt is receptive. No complaints of pain or discomfort at this time. Q15 min safety checks maintained. Will continue to monitor pt.

## 2013-02-25 NOTE — ED Provider Notes (Signed)
Noted that pt placed up for transfer to [facility] several hours ago. I called psych team at [facility] who indicates that yes, pt is accepted there and bed is ready. Nurses informed of need to transfer.  Pt resting comfortably. Nad. Pt on abx for probable uti, cx pending. Will need abx continued at bhh pending culture result.    Suzi Roots, MD 02/25/13 [phone]

## 2013-02-25 NOTE — ED Notes (Signed)
[facility] transport has been called. 

## 2013-02-25 NOTE — Progress Notes (Signed)
Patient ID: Kenneth Daugherty, male   DOB: Sep 19, 1940, 72 y.o.   MRN: [id number] Pt did not attend wrap up group at 2000. Pt remained in his room to sleep.
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
## 2013-02-25 NOTE — Progress Notes (Signed)
Admission Note  D: Patient admitted to [facility] from [facility]. During the admission, pt presented with appropriate affect and depressed mood; also tearful at times. Patient voiced that he's being feeling very depressed since his wife passed a month ago. Per report, RN was informed that the patient was SI with no plan, but does have access to firearms.   A: Support and encouragement provided to patient. Oriented patient to the unit and informed him of the unit rules/ policies of the hospital. Initiated Q15 minute checks for safety.  R: Patient receptive. He's not endorsing SI/HI or any auditory/visual hallucinations. Patient remains safe on the unit.

## 2013-02-25 NOTE — Tx Team (Signed)
Interdisciplinary Treatment Plan Update (Adult)  Date: 02/25/2013  Time Reviewed:  9:45 AM  Progress in Treatment: Attending groups: Yes Participating in groups:  Yes Taking medication as prescribed:  Yes Tolerating medication:  Yes Family/Significant othe contact made: CSW assessing  Patient understands diagnosis:  Yes Discussing patient identified problems/goals with staff:  Yes Medical problems stabilized or resolved:  Yes Denies suicidal/homicidal ideation: Yes Issues/concerns per patient self-inventory:  Yes Other:  New problem(s) identified: N/A  Discharge Plan or Barriers: CSW assessing for appropriate referrals.  Reason for Continuation of Hospitalization: Anxiety Depression Medication Stabilization  Comments: N/A  Estimated length of stay: 3-5 days  For review of initial/current patient goals, please see plan of care.  Attendees: Patient:     Family:     Physician:  Dr. Johnalagadda 02/25/2013 10:21 AM   Nursing:   Beverly Knight, RN 02/25/2013 10:21 AM   Clinical Social Worker:  Trace Wirick Horton, LCSW 02/25/2013 10:21 AM   Other: Neil Mashburn, PA 02/25/2013 10:21 AM   Other:  Maseta Dorley, MA care coordination 02/25/2013 10:21 AM   Other:  Quylle Hodnett, LCSW 02/25/2013 10:21 AM   Other:  Jennifer Clark, RN case manager 02/25/2013 10:21 AM   Other:    Other:    Other:    Other:    Other:    Other:     Scribe for Treatment Team:   Horton, Mardie Kellen Nicole, 02/25/2013 10:21 AM   

## 2013-02-26 DIAGNOSIS — F329 Major depressive disorder, single episode, unspecified: Secondary | ICD-10-CM

## 2013-02-26 DIAGNOSIS — Z634 Disappearance and death of family member: Secondary | ICD-10-CM

## 2013-02-26 LAB — URINE CULTURE

## 2013-02-26 NOTE — BHH Counselor (Signed)
Adult Comprehensive Assessment  Patient ID: Kenneth Daugherty, male   DOB: 1941-03-23, 72 y.o.   MRN: [id number]  Information Source: Information source: Patient  Current Stressors:  Educational / Learning stressors: N/A Employment / Job issues: Retired Family Relationships: N/A Surveyor, quantity / Lack of resources (include bankruptcy): N/A Housing / Lack of housing: N/A Physical health (include injuries & life threatening diseases): N/A Social relationships: N/A Substance abuse: N/A Bereavement / Loss: lost wife last month  Living/Environment/Situation:  Living Arrangements: Children Living conditions (as described by patient or guardian): Pt lives with grandson in [place].  Pt reports thatt he area isn't the best due to it being "rowdy". How long has patient lived in current situation?: 1-2 years What is atmosphere in current home: Supportive;Loving;Comfortable  Family History:  Marital status: Widowed Widowed, when?: 1 month ago Does patient have children?: Yes How many children?: 2 How is patient's relationship with their children?: Pt states that they knows 2 kids he refers to as grand kids but are not biologically his.    Childhood History:  By whom was/is the patient raised?: Both parents Additional childhood history information: Pt states that his parents were very strict.  Description of patient's relationship with caregiver when they were a child: Pt states that he got along well with his parents growing up.  Patient's description of current relationship with people who raised him/her: Parents are deceased.   Does patient have siblings?: Yes Number of Siblings: 10 Description of patient's current relationship with siblings: Pt states that he is close to one brother that lives in [place].   Did patient suffer any verbal/emotional/physical/sexual abuse as a child?: No Did patient suffer from severe childhood neglect?: No Has patient ever been sexually  abused/assaulted/raped as an adolescent or adult?: No Was the patient ever a victim of a crime or a disaster?: No Witnessed domestic violence?: No Has patient been effected by domestic violence as an adult?: No  Education:  Highest grade of school patient has completed: graduated high school Currently a Consulting civil engineer?: No Learning disability?: No  Employment/Work Situation:   Employment situation:  (retired) Patient's job has been impacted by current illness: No What is the longest time patient has a held a job?: 14 years Where was the patient employed at that time?: [employer] Has patient ever been in the [employer]?: No Has patient ever served in Buyer, retail?: No  Financial Resources:   Surveyor, quantity resources: Writer Does patient have a Lawyer or guardian?: No  Alcohol/Substance Abuse:   What has been your use of drugs/alcohol within the last 12 months?: Pt denies alcohol and drug abuse If attempted suicide, did drugs/alcohol play a role in this?: No Alcohol/Substance Abuse Treatment Hx: Denies past history If yes, describe treatment: N/A Has alcohol/substance abuse ever caused legal problems?: No  Social Support System:   Conservation officer, nature Support System: Good Describe Community Support System: Pt states that his family is supportive Type of faith/religion: Christian How does patient's faith help to cope with current illness?: prayer, church attendance  Leisure/Recreation:   Leisure and Hobbies: singing  Strengths/Needs:   What things does the patient do well?: singing In what areas does patient struggle / problems for patient: Depression, grief/loss, SI  Discharge Plan:   Does patient have access to transportation?: Yes Will patient be returning to same living situation after discharge?: Yes Currently receiving community mental health services: No If no, would patient like referral for services when discharged?: Yes (What county?) [facility] [place]) Does  patient  have financial barriers related to discharge medications?: No  Summary/Recommendations:     Patient is a 72 year old African American Male with a diagnosis of Depressive Disorder NOS.  Patient lives in [place] with his grandson.  Pt states that he was down because he lost his wife last month.  Patient will benefit from crisis stabilization, medication evaluation, group therapy and psycho education in addition to case management for discharge planning.    Horton, Salome Arnt. 02/26/2013

## 2013-02-26 NOTE — Progress Notes (Signed)
Patient ID: Kenneth Daugherty, male   DOB: 07/30/1940, 72 y.o.   MRN: [id number] D: Pt. Sitting in dayroom watching TV, reports depression at "9" of 10, "feels much better" "I guess the medicine helping" Pt. 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 A:Writer introduced self to client, reviewed medications and encouraged group. Staff will monitor q15min for safety. R: Pt. Is safe on the unit and attends group.

## 2013-02-26 NOTE — Progress Notes (Signed)
Recreation Therapy Notes  Date: 11.05.2014 Time: 3:00pm Location: [address] Dayroom  Group Topic: Communication  Goal Area(s) Addresses:  Patient will effectively communicate with peers in group.  Patient will verbalize benefit of healthy communication. Patient will verbalize positive effect of healthy communication on post d/c goals.   Behavioral Response: Did not attend.    Marykay Lex Kenneth Daugherty, LRT/CTRS  Niels Cranshaw L 02/26/2013 9:02 AM

## 2013-02-26 NOTE — BHH Group Notes (Signed)
B[facility] LCSW Group Therapy  [facility] [employer] 1:15 - 2:30 PM  02/26/2013   Type of Therapy:  Group Therapy  Participation Level:  Minimal  Participation Quality:  Attentive  Affect:  Appropriate  Cognitive:  Appropriate  Insight:  Developing/Improving   Engagement in Therapy:  Developing/Improving  Modes of Intervention:  Discussion, Education, Exploration, Problem-Solving, Rapport Building, Support   Summary of Progress/Problems:  Patient made no comments on the presentation but listened attentively to speaker from [facility].   Wynn Banker 02/26/2013

## 2013-02-26 NOTE — Progress Notes (Signed)
Morning Wellness Group  9:00 AM  The focus of this group is to educate the patient on the purpose and policies of crisis stabilization and provide a format to answer questions about their admission.  The group details unit policies and expectations of patients while admitted.  Patient participated in the warm-up stretching exercises and shared with the group a goal for the day.

## 2013-02-26 NOTE — Progress Notes (Signed)
D: Patient appropriate and cooperative with staff and peers. Patient has flat and sad affect and depressed mood. He reported on the self inventory sheet that he's sleeping well, appetite and ability to pay attention are both good and energy level is normal. Patient rated depression and feelings of hopelessness "1". He verbalized that he's feeling a lot better today, as opposed to yesterday and feels like he's going to make it outside of here.  A: Support and encouragement provided to patient. Administered scheduled medications per ordering MD. Monitor Q15 minute checks for safety.  R: Patient receptive. Denies SI/HI. Patient remains safe.

## 2013-02-26 NOTE — Progress Notes (Signed)
Adult Psychoeducational Group Note  Date:  02/26/2013 Time:  11:00am Group Topic/Focus:  Rediscovering Joy:   The focus of this group is to explore various ways to relieve stress in a positive manner.  Participation Level:  Active  Participation Quality:  Appropriate and Attentive  Affect:  Appropriate  Cognitive:  Alert and Appropriate  Insight: Appropriate  Engagement in Group:  Engaged  Modes of Intervention:  Discussion and Education  Additional Comments:  Pt attended and participated in group. Discussion was on rediscovering joy. Patient discussed ways they could bring joy back into their lives by making simple choices and setting boundaries.  Shelly Bombard D 02/26/2013, 1:00 PM

## 2013-02-26 NOTE — Progress Notes (Signed)
[facility] MD Progress Note  02/26/2013 11:22 AM Kenneth Daugherty  MRN:  [id number]  Subjective:  Patient has been compliant with the inpatient psychiatric program, actively participating in unit groups. Patient has been taking his medication as prescribed and has no reported side effects. Patient stated that he was relocated from [place] about a year ago and a primary care taker for his wife with dementia died about 2 weeks ago which his images stress trigger for his depression and suicidal ideation. Patient stated he feels safe and better today noting back he will be receiving the appropriate care and treatment here. Patient rated his depression 5/10 and anxiety 5/10 and contracts for safety. Patient stated that he felt like his wife is being get around and constantly reminding to him. He has been supported by his wife niece and grandson.  Diagnosis:   DSM5: Schizophrenia Disorders:   Obsessive-Compulsive Disorders:   Trauma-Stressor Disorders:   Substance/Addictive Disorders:   Depressive Disorders:    Axis I: Bereavement and Major Depression, single episode  ADL's:  Impaired  Sleep: Poor  Appetite:  Poor  Suicidal Ideation:  Patient has suicidal ideation but contracts for safety in the hospital Homicidal Ideation:  Denied AEB (as evidenced by):  Psychiatric Specialty Exam: ROS  Blood pressure 149/102, pulse 76, temperature 97.9 F (36.6 C), temperature source Oral, resp. rate 16, height 5' 6.5" (1.689 m), weight 77.111 kg (170 lb).Body mass index is 27.03 kg/(m^2).  General Appearance: Casual and Fairly Groomed  Eye Contact::  Minimal  Speech:  Clear and Coherent and Normal Rate  Volume:  Decreased  Mood:  Anxious and Depressed  Affect:  Depressed, Flat and Tearful  Thought Process:  Goal Directed and Intact  Orientation:  Full (Time, Place, and Person)  Thought Content:  Rumination  Suicidal Thoughts:  Yes.  without intent/plan  Homicidal Thoughts:  No  Memory:  Immediate;    Fair  Judgement:  Impaired  Insight:  Lacking  Psychomotor Activity:  Psychomotor Retardation and Restlessness  Concentration:  Fair  Recall:  Fair  Akathisia:  NA  Handed:  Right  AIMS (if indicated):     Assets:  Communication Skills Desire for Improvement Physical Health Resilience Social Support Transportation  Sleep:  Number of Hours: 6.75   Current Medications: Current Facility-Administered Medications  Medication Dose Route Frequency Provider Last Rate Last Dose  . acetaminophen (TYLENOL) tablet 650 mg  650 mg Oral Q6H PRN Verne Spurr, PA-C      . alum & mag hydroxide-simeth (MAALOX/MYLANTA) 200-200-20 MG/5ML suspension 30 mL  30 mL Oral Q4H PRN Verne Spurr, PA-C      . FLUoxetine (PROZAC) capsule 10 mg  10 mg Oral Daily Verne Spurr, PA-C   10 mg at 02/26/13 4098  . lisinopril (PRINIVIL,ZESTRIL) tablet 10 mg  10 mg Oral Daily Verne Spurr, PA-C   10 mg at 02/26/13 1191  . magnesium hydroxide (MILK OF MAGNESIA) suspension 30 mL  30 mL Oral Daily PRN Verne Spurr, PA-C      . nitrofurantoin (macrocrystal-monohydrate) (MACROBID) capsule 100 mg  100 mg Oral Q12H Verne Spurr, PA-C   100 mg at 02/26/13 4782  . traZODone (DESYREL) tablet 50 mg  50 mg Oral QHS Verne Spurr, PA-C   50 mg at 02/25/13 2107    Lab Results:  Results for orders placed during the hospital encounter of 02/24/13 (from the past 48 hour(s))  CBC     Status: None   Collection Time    02/24/13 12:52 PM  Result Value Range   WBC 4.5  4.0 - 10.5 K/uL   RBC 5.03  4.22 - 5.81 MIL/uL   Hemoglobin 14.4  13.0 - 17.0 g/dL   HCT 09.8  11.9 - 14.7 %   MCV 83.9  78.0 - 100.0 fL   MCH 28.6  26.0 - 34.0 pg   MCHC 34.1  30.0 - 36.0 g/dL   RDW 82.9  56.2 - 13.0 %   Platelets 208  150 - 400 K/uL  COMPREHENSIVE METABOLIC PANEL     Status: Abnormal   Collection Time    02/24/13 12:52 PM      Result Value Range   Sodium 143  135 - 145 mEq/L   Potassium 4.2  3.5 - 5.1 mEq/L   Chloride 104  96 - 112  mEq/L   CO2 28  19 - 32 mEq/L   Glucose, Bld 108 (*) 70 - 99 mg/dL   BUN 9  6 - 23 mg/dL   Creatinine, Ser 8.65  0.50 - 1.35 mg/dL   Calcium 9.8  8.4 - 78.4 mg/dL   Total Protein 7.2  6.0 - 8.3 g/dL   Albumin 3.8  3.5 - 5.2 g/dL   AST 12  0 - 37 U/L   ALT 7  0 - 53 U/L   Alkaline Phosphatase 70  39 - 117 U/L   Total Bilirubin 0.4  0.3 - 1.2 mg/dL   GFR calc non Af Amer 79 (*) >90 mL/min   GFR calc Af Amer >90  >90 mL/min   Comment: (NOTE)     The eGFR has been calculated using the CKD EPI equation.     This calculation has not been validated in all clinical situations.     eGFR's persistently <90 mL/min signify possible Chronic Kidney     Disease.  POCT I-STAT TROPONIN I     Status: None   Collection Time    02/24/13  1:05 PM      Result Value Range   Troponin i, poc 0.00  0.00 - 0.08 ng/mL   Comment 3            Comment: Due to the release kinetics of cTnI,     a negative result within the first hours     of the onset of symptoms does not rule out     myocardial infarction with certainty.     If myocardial infarction is still suspected,     repeat the test at appropriate intervals.  URINALYSIS, ROUTINE W REFLEX MICROSCOPIC     Status: Abnormal   Collection Time    02/24/13  3:39 PM      Result Value Range   Color, Urine YELLOW  YELLOW   APPearance CLOUDY (*) CLEAR   Specific Gravity, Urine 1.011  1.005 - 1.030   pH 6.0  5.0 - 8.0   Glucose, UA NEGATIVE  NEGATIVE mg/dL   Hgb urine dipstick TRACE (*) NEGATIVE   Bilirubin Urine NEGATIVE  NEGATIVE   Ketones, ur NEGATIVE  NEGATIVE mg/dL   Protein, ur NEGATIVE  NEGATIVE mg/dL   Urobilinogen, UA 0.2  0.0 - 1.0 mg/dL   Nitrite POSITIVE (*) NEGATIVE   Leukocytes, UA LARGE (*) NEGATIVE  URINE RAPID DRUG SCREEN (HOSP PERFORMED)     Status: None   Collection Time    02/24/13  3:39 PM      Result Value Range   Opiates NONE DETECTED  NONE DETECTED   Cocaine NONE DETECTED  NONE DETECTED   Benzodiazepines NONE DETECTED  NONE  DETECTED   Amphetamines NONE DETECTED  NONE DETECTED   Tetrahydrocannabinol NONE DETECTED  NONE DETECTED   Barbiturates NONE DETECTED  NONE DETECTED   Comment:            DRUG SCREEN FOR MEDICAL PURPOSES     ONLY.  IF CONFIRMATION IS NEEDED     FOR ANY PURPOSE, NOTIFY LAB     WITHIN 5 DAYS.                LOWEST DETECTABLE LIMITS     FOR URINE DRUG SCREEN     Drug Class       Cutoff (ng/mL)     Amphetamine      1000     Barbiturate      200     Benzodiazepine   200     Tricyclics       300     Opiates          300     Cocaine          300     THC              50  URINE MICROSCOPIC-ADD ON     Status: Abnormal   Collection Time    02/24/13  3:39 PM      Result Value Range   Squamous Epithelial / LPF RARE  RARE   WBC, UA 21-50  <3 WBC/hpf   RBC / HPF 0-2  <3 RBC/hpf   Bacteria, UA MANY (*) RARE   Casts HYALINE CASTS (*) NEGATIVE   Urine-Other MUCOUS PRESENT    URINE CULTURE     Status: None   Collection Time    02/24/13  3:39 PM      Result Value Range   Specimen Description URINE, CLEAN CATCH     Special Requests NONE     Culture  Setup Time       Value: 02/24/2013 20:56     Performed at [facility] Count       Value: >=100,000 COLONIES/ML     Performed at [facility]   Culture       Value: ESCHERICHIA COLI     GRAM NEGATIVE RODS     Performed at [facility]   Report Status PENDING    POCT I-STAT TROPONIN I     Status: None   Collection Time    02/24/13  4:26 PM      Result Value Range   Troponin i, poc 0.03  0.00 - 0.08 ng/mL   Comment 3            Comment: Due to the release kinetics of cTnI,     a negative result within the first hours     of the onset of symptoms does not rule out     myocardial infarction with certainty.     If myocardial infarction is still suspected,     repeat the test at appropriate intervals.  POCT I-STAT TROPONIN I     Status: None   Collection Time    02/24/13  8:06 PM      Result Value Range    Troponin i, poc 0.02  0.00 - 0.08 ng/mL   Comment 3            Comment: Due to the release kinetics of cTnI,     a negative result  within the first hours     of the onset of symptoms does not rule out     myocardial infarction with certainty.     If myocardial infarction is still suspected,     repeat the test at appropriate intervals.  ETHANOL     Status: None   Collection Time    02/24/13  8:49 PM      Result Value Range   Alcohol, Ethyl (B) <11  0 - 11 mg/dL   Comment:            LOWEST DETECTABLE LIMIT FOR     SERUM ALCOHOL IS 11 mg/dL     FOR MEDICAL PURPOSES ONLY    Physical Findings: AIMS:  , ,  ,  ,    CIWA:    COWS:     Treatment Plan Summary: Daily contact with patient to assess and evaluate symptoms and progress in treatment Medication management  Plan: Treatment Plan/Recommendations:   1. Admit for crisis management and stabilization. 2. Medication management to reduce current symptoms to base line and improve the patient's overall level of functioning. Continue current medications without changes. 3. Treat health problems as indicated. 4. Develop treatment plan to decrease risk of relapse upon discharge and to reduce the need for readmission. 5. Psycho-social education regarding relapse prevention and self care. 6. Health care follow up as needed for medical problems. 7. Restart home medications where appropriate. 8. Disposition plan is in progress may be discharged in 3 days if he continued to show progress clinically and contracts for safety  Medical Decision Making Problem Points:  New problem, with no additional work-up planned (3), Review of last therapy session (1) and Review of psycho-social stressors (1) Data Points:  Review or order clinical lab tests (1) Review or order medicine tests (1) Review of medication regiment & side effects (2) Review of new medications or change in dosage (2)  I certify that inpatient services furnished can reasonably  be expected to improve the patient's condition.   Cephus Tupy,[name] 02/26/2013, 11:22 AM

## 2013-02-27 DIAGNOSIS — F322 Major depressive disorder, single episode, severe without psychotic features: Secondary | ICD-10-CM | POA: Diagnosis present

## 2013-02-27 DIAGNOSIS — F4321 Adjustment disorder with depressed mood: Secondary | ICD-10-CM

## 2013-02-27 DIAGNOSIS — Z634 Disappearance and death of family member: Secondary | ICD-10-CM

## 2013-02-27 MED ORDER — LISINOPRIL 10 MG PO TABS: ORAL_TABLET | ORAL | Status: DC

## 2013-02-27 MED ORDER — NITROFURANTOIN MONOHYD MACRO 100 MG PO CAPS: ORAL_CAPSULE | ORAL | Status: DC

## 2013-02-27 MED ORDER — TRAZODONE HCL 50 MG PO TABS: ORAL_TABLET | ORAL | Status: DC

## 2013-02-27 MED ORDER — FLUOXETINE HCL 10 MG PO CAPS: ORAL_CAPSULE | ORAL | Status: DC

## 2013-02-27 NOTE — Tx Team (Signed)
Interdisciplinary Treatment Plan Update (Adult)  Date: 02/27/2013  Time Reviewed:  9:45 AM  Progress in Treatment: Attending groups: Yes Participating in groups:  Yes Taking medication as prescribed:  Yes Tolerating medication:  Yes Family/Significant othe contact made: No Patient understands diagnosis:  Yes Discussing patient identified problems/goals with staff:  Yes Medical problems stabilized or resolved:  Yes Denies suicidal/homicidal ideation: Yes Issues/concerns per patient self-inventory:  Yes Other:  New problem(s) identified: N/A  Discharge Plan or Barriers: Pt will follow up at [facility] for medication management and therapy  Reason for Continuation of Hospitalization: Stable to d/c today  Comments: N/A  Estimated length of stay: D/C today  For review of initial/current patient goals, please see plan of care.  Attendees: Patient:  Kenneth Daugherty 02/27/2013 10:45 AM   Family:     Physician:  Dr. Javier Glazier 02/27/2013 10:45 AM   Nursing:   Lowella Grip, RN 02/27/2013 10:45 AM   Clinical Social Worker:  Reyes Ivan, LCSW 02/27/2013 10:45 AM   Other: Verne Spurr, PA 02/27/2013 10:45 AM   Other:  Frankey Shown, MA care coordination 02/27/2013 10:45 AM   Other:  Juline Patch, LCSW 02/27/2013 10:45 AM   Other:  Nestor Ramp, RN 02/27/2013 10:46 AM   Other:    Other:    Other:    Other:    Other:      Scribe for Treatment Team:   Carmina Miller, 02/27/2013 , 10:45 AM

## 2013-02-27 NOTE — [facility] Suicide Risk Assessment (Signed)
[facility] Adult Inpatient Family/Significant Other Suicide Prevention Education  Suicide Prevention Education:   Patient Refusal for Family/Significant Other Suicide Prevention Education: The patient has refused to provide written consent for family/significant other to be provided Family/Significant Other Suicide Prevention Education during admission and/or prior to discharge.  Physician notified.  CSW provided suicide prevention information with patient.    The suicide prevention education provided includes the following:  Suicide risk factors  Suicide prevention and interventions  National Suicide Hotline telephone number  [facility] assessment telephone number  [facility] Emergency Assistance 911  [facility] and/or Residential Mobile Crisis Unit telephone number  Pt didn't refused but had not contact information for his grandson.  Reyes Ivan, LCSW 02/27/2013 10:34 AM

## 2013-02-27 NOTE — Progress Notes (Addendum)
Discharge Note: Discharge instructions/prescriptions/medication samples given to patient. Patient verbalized understanding of discharge instructions and prescriptions. Returned belongings to patient. Denies SI/HI/AVH. Patient d/c without incident to the lobby and transported home by his nephew.

## 2013-02-27 NOTE — [facility] Group Notes (Signed)
[facility] LCSW Aftercare Discharge Planning Group Note   02/27/2013 12:46 PM    Participation Quality:  Appropraite  Mood/Affect:  Appropriate  Depression Rating:  1  Anxiety Rating:  1  Thoughts of Suicide:  No  Will you contract for safety?   NA  Current AVH:  No  Plan for Discharge/Comments:  Patient attending discharge planning group and actively participated in group.  Patient reports doing well.  He is working with his CSW on discharge plans/follow up.  CSW provided all participants with daily workbook.   Transportation Means: Patient has transportation.   Supports:  Patient has a support system.   Kenneth Daugherty, Kenneth Daugherty

## 2013-02-27 NOTE — [facility] Group Notes (Signed)
[facility] LCSW Group Therapy  Feelings Around Relapse 1:15 -2:30        02/27/2013  3:21 PM   Type of Therapy:  Group Therapy  Participation Level:  Appropriate  Participation Quality:  Appropriate  Affect:  Appropriate  Cognitive:  Attentive Appropriate  Insight:  Developing/Improving  Engagement in Therapy: Developing/Improving  Modes of Intervention:  Discussion Exploration Problem-Solving Supportive  Summary of Progress/Problems:  The topic for today was feelings around relapse.    Patient processed feelings toward relapse and was able to relate to peers. Patient shared relapse for him would be not attending church.  He advised he had to stop church attendance during his wife's illness.  He is looking forward to singing in the choir again.  Patient identified coping skills that can be used to prevent a relapse.   Wynn Banker 02/27/2013 3:21 PM

## 2013-02-27 NOTE — Progress Notes (Addendum)
[facility] Adult Case Management Discharge Plan :  Will you be returning to the same living situation after discharge: Yes,  returning to own home At discharge, do you have transportation home?:Yes,  access to transportation Do you have the ability to pay for your medications:Yes,  access to meds  Release of information consent forms completed and in the chart;  Patient's signature needed at discharge.  Patient to Follow up at: Follow-up Information   Follow up with [facility] On 03/03/2013. (Walk in on this date for hospital discharge appointment. Walk in clinic is Monday - Friday 8 am - 3 pm. They will then schedule you for medication management and therapy. )    Contact information:   [address], [place] [address] Phone: [phone] Fax: [phone]      Patient denies SI/HI:   Yes,  denies SI/HI    Safety Planning and Suicide Prevention discussed:  Yes,  discussed with pt, unable to reach pt's grandson.  See suicide prevention education note.   CSW provided pt with resources in the community for additional support, Hospice for grief/loss counseling, [employer] and Senior Resources.    Carmina Miller 02/27/2013, 10:52 AM

## 2013-02-27 NOTE — Progress Notes (Signed)
Adult Psychoeducational Group Note  Date:  02/27/2013 Time:  1:35 PM  Group Topic/Focus:  Early Warning Signs:   The focus of this group is to help patients identify signs or symptoms they exhibit before slipping into an unhealthy state or crisis.  Participation Level:  Active  Participation Quality:  Appropriate and Attentive  Affect:  Appropriate  Cognitive:  Alert and Appropriate  Insight: Good  Engagement in Group:  Engaged  Modes of Intervention:  Activity, Discussion, Exploration, Socialization and Support  Additional Comments:  Pt came to group and shared that sadness and neglecting things he used to care about are two of his early warning signs. Pt plans on using listening to music as a coping skill to prevent relapse.   Cathlean Cower 02/27/2013, 1:35 PM

## 2013-02-27 NOTE — Discharge Summary (Signed)
Physician Discharge Summary Note  Patient:  Kenneth Daugherty is an 72 y.o., male MRN:  [id number] DOB:  [birth year] Patient phone:  [phone] (home)  Patient address:   [address] [place] [address],   Date of Admission:  02/25/2013 Date of Discharge: 02/27/2013   Reason for Admission:  MDD with complicated grief  Discharge Diagnoses: Active Problems:   * No active hospital problems. *  ROS  DSM5: Schizophrenia Disorders:  Obsessive-Compulsive Disorders:  Trauma-Stressor Disorders:  Substance/Addictive Disorders:  Depressive Disorders: Major Depressive Disorder - Severe (296.23)  AXIS I: MDD severe recurrent, complicated grief  AXIS II: Antisocial personality disorder  AXIS III:  Past Medical History   Diagnosis  Date   .  Hyperlipidemia    .  Hypertension    .  UTI (lower urinary tract infection)    AXIS IV: other psychosocial or environmental problems  AXIS V: 41-50 serious symptoms  Level of Care:  OP  Hospital Course:       Kenneth Daugherty isa 72 year old AA male reports that he has a history of depression which he reports has been worsening since his wife died about 3 weeks ago. He has stopped taking all of his medications. He endorses suicidal thoughts with no plan and no previous attempts, but states he does have firearms in his home.  They were married for 48 years.  His labs were unremarkable with the exception of his urine which indicated a UTI.      When he arrived on the unit he was evaluated and his symptoms were identified as poor appetite, poor sleep, increased mood changes with irritability, crying spells, isolation and feeling lost. Medication management was initiated with Prozac at 10mg  po qd for his depression and Macrobid 100mg  po BID for his UTI.  He was encouraged to participate in unit programming and did so.  He was pleasant and cooperative. Kenneth Daugherty seemed to enjoy the company of being in groups.     He reported each day that he felt better  and was grateful for being in the hospital. His sleep improved with Trazodone. Kenneth Daugherty mood and affect were more positive and showed a good response to the medication.     By the day of discharge he was in much improved condition than upon arrival and denied SI/HI or AVH. He noted that his appetite, sleep, and mood had all improved. He was motivated to continue taking his medication and agreed to follow up as noted below. Several recommendations were made for him in regards to follow up. Hospice was recommended for grief support for the loss of his wife, and [facility] and [facility] were recommended for his need for socialization.   Consults:  None  Significant Diagnostic Studies:  labs: CBC, CMP, UA, C&S, UDS  Discharge Vitals:   Blood pressure 153/85, pulse 83, temperature 97.8 F (36.6 C), temperature source Oral, resp. rate 16, height 5' 6.5" (1.689 m), weight 77.111 kg (170 lb). Body mass index is 27.03 kg/(m^2). Lab Results:   Results for orders placed during the hospital encounter of 02/24/13 (from the past 72 hour(s))  CBC     Status: None   Collection Time    02/24/13 12:52 PM      Result Value Range   WBC 4.5  4.0 - 10.5 K/uL   RBC 5.03  4.22 - 5.81 MIL/uL   Hemoglobin 14.4  13.0 - 17.0 g/dL   HCT 46.9  62.9 - 52.8 %   MCV  83.9  78.0 - 100.0 fL   MCH 28.6  26.0 - 34.0 pg   MCHC 34.1  30.0 - 36.0 g/dL   RDW 16.1  09.6 - 04.5 %   Platelets 208  150 - 400 K/uL  COMPREHENSIVE METABOLIC PANEL     Status: Abnormal   Collection Time    02/24/13 12:52 PM      Result Value Range   Sodium 143  135 - 145 mEq/L   Potassium 4.2  3.5 - 5.1 mEq/L   Chloride 104  96 - 112 mEq/L   CO2 28  19 - 32 mEq/L   Glucose, Bld 108 (*) 70 - 99 mg/dL   BUN 9  6 - 23 mg/dL   Creatinine, Ser 4.09  0.50 - 1.35 mg/dL   Calcium 9.8  8.4 - 81.1 mg/dL   Total Protein 7.2  6.0 - 8.3 g/dL   Albumin 3.8  3.5 - 5.2 g/dL   AST 12  0 - 37 U/L   ALT 7  0 - 53 U/L   Alkaline Phosphatase 70  39 - 117 U/L    Total Bilirubin 0.4  0.3 - 1.2 mg/dL   GFR calc non Af Amer 79 (*) >90 mL/min   GFR calc Af Amer >90  >90 mL/min   Comment: (NOTE)     The eGFR has been calculated using the CKD EPI equation.     This calculation has not been validated in all clinical situations.     eGFR's persistently <90 mL/min signify possible Chronic Kidney     Disease.  POCT I-STAT TROPONIN I     Status: None   Collection Time    02/24/13  1:05 PM      Result Value Range   Troponin i, poc 0.00  0.00 - 0.08 ng/mL   Comment 3            Comment: Due to the release kinetics of cTnI,     a negative result within the first hours     of the onset of symptoms does not rule out     myocardial infarction with certainty.     If myocardial infarction is still suspected,     repeat the test at appropriate intervals.  URINALYSIS, ROUTINE W REFLEX MICROSCOPIC     Status: Abnormal   Collection Time    02/24/13  3:39 PM      Result Value Range   Color, Urine YELLOW  YELLOW   APPearance CLOUDY (*) CLEAR   Specific Gravity, Urine 1.011  1.005 - 1.030   pH 6.0  5.0 - 8.0   Glucose, UA NEGATIVE  NEGATIVE mg/dL   Hgb urine dipstick TRACE (*) NEGATIVE   Bilirubin Urine NEGATIVE  NEGATIVE   Ketones, ur NEGATIVE  NEGATIVE mg/dL   Protein, ur NEGATIVE  NEGATIVE mg/dL   Urobilinogen, UA 0.2  0.0 - 1.0 mg/dL   Nitrite POSITIVE (*) NEGATIVE   Leukocytes, UA LARGE (*) NEGATIVE  URINE RAPID DRUG SCREEN (HOSP PERFORMED)     Status: None   Collection Time    02/24/13  3:39 PM      Result Value Range   Opiates NONE DETECTED  NONE DETECTED   Cocaine NONE DETECTED  NONE DETECTED   Benzodiazepines NONE DETECTED  NONE DETECTED   Amphetamines NONE DETECTED  NONE DETECTED   Tetrahydrocannabinol NONE DETECTED  NONE DETECTED   Barbiturates NONE DETECTED  NONE DETECTED   Comment:  DRUG SCREEN FOR MEDICAL PURPOSES     ONLY.  IF CONFIRMATION IS NEEDED     FOR ANY PURPOSE, NOTIFY LAB     WITHIN 5 DAYS.                LOWEST  DETECTABLE LIMITS     FOR URINE DRUG SCREEN     Drug Class       Cutoff (ng/mL)     Amphetamine      1000     Barbiturate      200     Benzodiazepine   200     Tricyclics       300     Opiates          300     Cocaine          300     THC              50  URINE MICROSCOPIC-ADD ON     Status: Abnormal   Collection Time    02/24/13  3:39 PM      Result Value Range   Squamous Epithelial / LPF RARE  RARE   WBC, UA 21-50  <3 WBC/hpf   RBC / HPF 0-2  <3 RBC/hpf   Bacteria, UA MANY (*) RARE   Casts HYALINE CASTS (*) NEGATIVE   Urine-Other MUCOUS PRESENT    URINE CULTURE     Status: None   Collection Time    02/24/13  3:39 PM      Result Value Range   Specimen Description URINE, CLEAN CATCH     Special Requests NONE     Culture  Setup Time       Value: 02/24/2013 20:56     Performed at [facility] Count       Value: >=100,000 COLONIES/ML     Performed at [facility]   Culture       Value: ESCHERICHIA COLI     Note: Two isolates with different morphologies were identified as the same organism.The most resistant organism was reported.     Performed at [facility]   Report Status 02/26/2013 FINAL     Organism ID, Bacteria ESCHERICHIA COLI    POCT I-STAT TROPONIN I     Status: None   Collection Time    02/24/13  4:26 PM      Result Value Range   Troponin i, poc 0.03  0.00 - 0.08 ng/mL   Comment 3            Comment: Due to the release kinetics of cTnI,     a negative result within the first hours     of the onset of symptoms does not rule out     myocardial infarction with certainty.     If myocardial infarction is still suspected,     repeat the test at appropriate intervals.  POCT I-STAT TROPONIN I     Status: None   Collection Time    02/24/13  8:06 PM      Result Value Range   Troponin i, poc 0.02  0.00 - 0.08 ng/mL   Comment 3            Comment: Due to the release kinetics of cTnI,     a negative result within the first hours      of the onset of symptoms does not rule out     myocardial infarction with  certainty.     If myocardial infarction is still suspected,     repeat the test at appropriate intervals.  ETHANOL     Status: None   Collection Time    02/24/13  8:49 PM      Result Value Range   Alcohol, Ethyl (B) <11  0 - 11 mg/dL   Comment:            LOWEST DETECTABLE LIMIT FOR     SERUM ALCOHOL IS 11 mg/dL     FOR MEDICAL PURPOSES ONLY    Physical Findings: AIMS: Facial and Oral Movements Muscles of Facial Expression: None, normal Lips and Perioral Area: None, normal Jaw: None, normal Tongue: None, normal,Extremity Movements Upper (arms, wrists, hands, fingers): None, normal Lower (legs, knees, ankles, toes): None, normal, Trunk Movements Neck, shoulders, hips: None, normal, Overall Severity Severity of abnormal movements (highest score from questions above): None, normal Incapacitation due to abnormal movements: None, normal Patient's awareness of abnormal movements (rate only patient's report): No Awareness, Dental Status Current problems with teeth and/or dentures?: No Does patient usually wear dentures?: No  CIWA:    COWS:     Psychiatric Specialty Exam: See Psychiatric Specialty Exam and Suicide Risk Assessment completed by Attending Physician prior to discharge.  Discharge destination:  Home  Is patient on multiple antipsychotic therapies at discharge:  No   Has Patient had three or more failed trials of antipsychotic monotherapy by history:  No  Recommended Plan for Multiple Antipsychotic Therapies: NA  Discharge Orders   Future Orders Complete By Expires   Diet - low sodium heart healthy  As directed    Discharge instructions  As directed    Comments:     Take all your medications as prescribed by your mental healthcare provider. Report any adverse effects and or reactions from your medicines to your outpatient provider promptly. Patient is instructed and cautioned to not engage  in alcohol and or illegal drug use while on prescription medicines. In the event of worsening symptoms, patient is instructed to call the crisis hotline, 911 and or go to the nearest ED for appropriate evaluation and treatment of symptoms. Follow-up with your primary care provider for your other medical issues, concerns and or health care needs.   Increase activity slowly  As directed        Medication List       Indication   aspirin 81 MG tablet  Take 81 mg by mouth as needed. For pain      FLUoxetine 10 MG capsule  Commonly known as:  PROZAC  Take one capsule each morning for depression.   Indication:  Depression     lisinopril 10 MG tablet  Commonly known as:  PRINIVIL,ZESTRIL  Take one capsule each morning for high blood pressure.   Indication:  High Blood Pressure     nitrofurantoin (macrocrystal-monohydrate) 100 MG capsule  Commonly known as:  MACROBID  Take one capsule every 12 hours for urinary tract infection until pills are gone.   Indication:  Urinary Tract Infection     traZODone 50 MG tablet  Commonly known as:  DESYREL  Take one capsule at bedtime for insomnia.   Indication:  Trouble Sleeping           Follow-up Information   Follow up with [facility] On 03/03/2013. (Walk in on this date for hospital discharge appointment. Walk in clinic is Monday - Friday 8 am - 3 pm. They will then schedule you for medication  management and therapy. )    Contact information:   [address] [place] [address] Phone: [phone] Fax: [phone]      Follow-up recommendations:   Activities: Resume activity as tolerated. Diet: Heart healthy low sodium diet Tests: Follow up testing will be determined by your out patient provider. Comments:   Patient is encouraged to participate in Hospice for grief support as well as [facility] daily activities.  Total Discharge Time:  Less than 30 minutes.  Signed: Rona Ravens. Mashburn RPAC 10:40 AM 02/27/2013  Patient was  seen face-to-face for psychiatric evaluation, suicide risk assessment, case discussed with the treatment team meeting and physician extenders. May treatment plan and Reviewed the information documented and agree with the treatment plan.  Elana Jian,[name] 02/27/2013 7:18 PM

## 2013-02-27 NOTE — ED Notes (Signed)
+   Urine Patient treated with Nitrofuratoin.sensitive to same per protocol MD.

## 2013-02-27 NOTE — [facility] Suicide Risk Assessment (Signed)
Suicide Risk Assessment  Discharge Assessment     Demographic Factors:  Male, Low socioeconomic status and Unemployed  Mental Status Per Nursing Assessment::   On Admission:     Current Mental Status by Physician: Mental Status Examination: Patient appeared as per his stated age, casually dressed, and fairly groomed, and maintaining good eye contact. Patient has good mood and his affect was constricted. He has normal rate, rhythm, and volume of speech. His thought process is linear and goal directed. Patient has denied suicidal, homicidal ideations, intentions or plans. Patient has no evidence of auditory or visual hallucinations, delusions, and paranoia. Patient has fair insight judgment and impulse control.  Loss Factors: Loss of significant relationship  Historical Factors: NA  Risk Reduction Factors:   Sense of responsibility to family, Religious beliefs about death, Living with another person, especially a relative, Positive social support, Positive therapeutic relationship and Positive coping skills or problem solving skills  Continued Clinical Symptoms:  Depression:   Recent sense of peace/wellbeing Unstable or Poor Therapeutic Relationship Medical Diagnoses and Treatments/Surgeries  Cognitive Features That Contribute To Risk:  Polarized thinking    Suicide Risk:  Minimal: No identifiable suicidal ideation.  Patients presenting with no risk factors but with morbid ruminations; may be classified as minimal risk based on the severity of the depressive symptoms  Discharge Diagnoses:   AXIS I:  Bereavement and Depressive Disorder NOS AXIS II:  Deferred AXIS III:   Past Medical History  Diagnosis Date  . Hyperlipidemia   . Hypertension   . UTI (lower urinary tract infection)    AXIS IV:  other psychosocial or environmental problems, problems related to social environment and problems with primary support group AXIS V:  61-70 mild symptoms  Plan Of Care/Follow-up  recommendations:  Activity:  As tolerated Diet:  Regular  Is patient on multiple antipsychotic therapies at discharge:  No   Has Patient had three or more failed trials of antipsychotic monotherapy by history:  No  Recommended Plan for Multiple Antipsychotic Therapies: NA  Nehemiah Settle., M.D. 02/27/2013, 11:45 AM

## 2013-02-28 ENCOUNTER — Emergency Department (HOSPITAL_COMMUNITY): Payer: Medicare PPO

## 2013-02-28 ENCOUNTER — Observation Stay (HOSPITAL_COMMUNITY)
Admission: EM | Admit: 2013-02-28 | Discharge: 2013-03-01 | Disposition: A | Discharge status: Home / Self Care | Payer: Medicare PPO | Attending: Internal Medicine | Admitting: Internal Medicine

## 2013-02-28 ENCOUNTER — Encounter (HOSPITAL_COMMUNITY): Payer: Self-pay | Admitting: Emergency Medicine

## 2013-02-28 DIAGNOSIS — R079 Chest pain, unspecified: Secondary | ICD-10-CM

## 2013-02-28 DIAGNOSIS — N390 Urinary tract infection, site not specified: Secondary | ICD-10-CM | POA: Insufficient documentation

## 2013-02-28 DIAGNOSIS — F411 Generalized anxiety disorder: Secondary | ICD-10-CM | POA: Insufficient documentation

## 2013-02-28 DIAGNOSIS — Z79899 Other long term (current) drug therapy: Secondary | ICD-10-CM | POA: Insufficient documentation

## 2013-02-28 DIAGNOSIS — J45901 Unspecified asthma with (acute) exacerbation: Secondary | ICD-10-CM | POA: Insufficient documentation

## 2013-02-28 DIAGNOSIS — I2510 Atherosclerotic heart disease of native coronary artery without angina pectoris: Secondary | ICD-10-CM | POA: Diagnosis not present

## 2013-02-28 DIAGNOSIS — F17200 Nicotine dependence, unspecified, uncomplicated: Secondary | ICD-10-CM | POA: Insufficient documentation

## 2013-02-28 DIAGNOSIS — R251 Tremor, unspecified: Secondary | ICD-10-CM

## 2013-02-28 DIAGNOSIS — M25551 Pain in right hip: Secondary | ICD-10-CM | POA: Diagnosis present

## 2013-02-28 DIAGNOSIS — Z9889 Other specified postprocedural states: Secondary | ICD-10-CM | POA: Insufficient documentation

## 2013-02-28 DIAGNOSIS — I252 Old myocardial infarction: Secondary | ICD-10-CM | POA: Insufficient documentation

## 2013-02-28 DIAGNOSIS — E785 Hyperlipidemia, unspecified: Secondary | ICD-10-CM

## 2013-02-28 DIAGNOSIS — F322 Major depressive disorder, single episode, severe without psychotic features: Secondary | ICD-10-CM

## 2013-02-28 DIAGNOSIS — E7800 Pure hypercholesterolemia, unspecified: Secondary | ICD-10-CM | POA: Insufficient documentation

## 2013-02-28 DIAGNOSIS — I10 Essential (primary) hypertension: Secondary | ICD-10-CM

## 2013-02-28 DIAGNOSIS — Z634 Disappearance and death of family member: Secondary | ICD-10-CM

## 2013-02-28 DIAGNOSIS — R0789 Other chest pain: Principal | ICD-10-CM | POA: Insufficient documentation

## 2013-02-28 HISTORY — DX: Acute myocardial infarction, unspecified: I21.9

## 2013-02-28 HISTORY — DX: Depression, unspecified: F32.A

## 2013-02-28 HISTORY — DX: Unspecified asthma, uncomplicated: J45.909

## 2013-02-28 HISTORY — DX: Anxiety disorder, unspecified: F41.9

## 2013-02-28 HISTORY — DX: Atherosclerotic heart disease of native coronary artery without angina pectoris: I25.10

## 2013-02-28 HISTORY — DX: Major depressive disorder, single episode, unspecified: F32.9

## 2013-02-28 LAB — CBC
HCT: 43.8 % (ref 39.0–52.0)
Hemoglobin: 15.2 g/dL (ref 13.0–17.0)
MCH: 29.1 pg (ref 26.0–34.0)
MCH: 29.5 pg (ref 26.0–34.0)
MCHC: 34.7 g/dL (ref 30.0–36.0)
MCV: 84.2 fL (ref 78.0–100.0)
Platelets: 218 10*3/uL (ref 150–400)
Platelets: 216 10*3/uL (ref 150–400)
RBC: 5.22 MIL/uL (ref 4.22–5.81)
RBC: 5.01 MIL/uL (ref 4.22–5.81)
RDW: 13.3 % (ref 11.5–15.5)
WBC: 5.7 10*3/uL (ref 4.0–10.5)

## 2013-02-28 LAB — POCT I-STAT TROPONIN I: Troponin i, poc: 0.01 ng/mL (ref 0.00–0.08)

## 2013-02-28 LAB — BASIC METABOLIC PANEL
CO2: 24 mEq/L (ref 19–32)
Calcium: 9.8 mg/dL (ref 8.4–10.5)
GFR calc non Af Amer: 79 mL/min — ABNORMAL LOW (ref >90)
Glucose, Bld: 116 mg/dL — ABNORMAL HIGH (ref 70–99)
Potassium: 4.5 mEq/L (ref 3.5–5.1)
Sodium: 137 mEq/L (ref 135–145)

## 2013-02-28 LAB — PRO B NATRIURETIC PEPTIDE: Pro B Natriuretic peptide (BNP): 99.9 pg/mL (ref 0–125)

## 2013-02-28 LAB — LIPID PANEL
Cholesterol: 212 mg/dL — ABNORMAL HIGH (ref 0–200)
LDL Cholesterol: 152 mg/dL — ABNORMAL HIGH (ref 0–99)
Triglycerides: 93 mg/dL (ref <150)
VLDL: 19 mg/dL (ref 0–40)

## 2013-02-28 LAB — PHOSPHORUS: Phosphorus: 3.7 mg/dL (ref 2.3–4.6)

## 2013-02-28 LAB — CREATININE, SERUM: Creatinine, Ser: 0.91 mg/dL (ref 0.50–1.35)

## 2013-02-28 LAB — MAGNESIUM: Magnesium: 1.9 mg/dL (ref 1.5–2.5)

## 2013-02-28 LAB — RAPID URINE DRUG SCREEN, HOSP PERFORMED
Benzodiazepines: NOT DETECTED
Opiates: NOT DETECTED

## 2013-02-28 MED ORDER — PANTOPRAZOLE SODIUM 40 MG PO TBEC
40.0000 mg | DELAYED_RELEASE_TABLET | Freq: Every day | ORAL | Status: DC
  Administered 2013-02-28: 40 mg via ORAL
  Filled 2013-02-28: qty 1

## 2013-02-28 MED ORDER — MORPHINE SULFATE 2 MG/ML IJ SOLN: 2.0000 mg | INTRAMUSCULAR | Status: DC | PRN

## 2013-02-28 MED ORDER — HEPARIN SODIUM (PORCINE) 5000 UNIT/ML IJ SOLN
5000.0000 [IU] | Freq: Three times a day (TID) | INTRAMUSCULAR | Status: DC
  Administered 2013-02-28 – 2013-03-01 (×2): 5000 [IU] via SUBCUTANEOUS
  Filled 2013-02-28 (×6): qty 1

## 2013-02-28 MED ORDER — ASPIRIN EC 81 MG PO TBEC
81.0000 mg | DELAYED_RELEASE_TABLET | Freq: Every day | ORAL | Status: DC
  Administered 2013-02-28 – 2013-03-01 (×2): 81 mg via ORAL
  Filled 2013-02-28 (×2): qty 1

## 2013-02-28 MED ORDER — LISINOPRIL 10 MG PO TABS
10.0000 mg | ORAL_TABLET | Freq: Every day | ORAL | Status: DC
  Administered 2013-03-01: 10 mg via ORAL
  Filled 2013-02-28: qty 1

## 2013-02-28 MED ORDER — NITROFURANTOIN MONOHYD MACRO 100 MG PO CAPS
100.0000 mg | ORAL_CAPSULE | Freq: Three times a day (TID) | ORAL | Status: DC
  Administered 2013-02-28 – 2013-03-01 (×2): 100 mg via ORAL
  Filled 2013-02-28 (×5): qty 1

## 2013-02-28 MED ORDER — METOPROLOL TARTRATE 25 MG PO TABS
25.0000 mg | ORAL_TABLET | Freq: Two times a day (BID) | ORAL | Status: DC
  Administered 2013-02-28 – 2013-03-01 (×2): 25 mg via ORAL
  Filled 2013-02-28 (×4): qty 1

## 2013-02-28 MED ORDER — ONDANSETRON HCL 4 MG/2ML IJ SOLN: 4.0000 mg | Freq: Four times a day (QID) | INTRAMUSCULAR | Status: DC | PRN

## 2013-02-28 MED ORDER — NITROGLYCERIN 0.4 MG SL SUBL: 0.4000 mg | SUBLINGUAL_TABLET | SUBLINGUAL | Status: DC | PRN

## 2013-02-28 MED ORDER — ACETAMINOPHEN 325 MG PO TABS
650.0000 mg | ORAL_TABLET | ORAL | Status: DC | PRN
Start: 2013-02-28 — End: 2013-03-01

## 2013-02-28 MED ORDER — TRAZODONE HCL 50 MG PO TABS
50.0000 mg | ORAL_TABLET | Freq: Every day | ORAL | Status: DC
  Administered 2013-02-28: 50 mg via ORAL
  Filled 2013-02-28 (×2): qty 1

## 2013-02-28 MED ORDER — ONDANSETRON 4 MG PO TBDP
4.0000 mg | ORAL_TABLET | Freq: Once | ORAL | Status: AC
  Administered 2013-02-28: 4 mg via ORAL
  Filled 2013-02-28: qty 1

## 2013-02-28 MED ORDER — FLUOXETINE HCL 10 MG PO CAPS
10.0000 mg | ORAL_CAPSULE | Freq: Every day | ORAL | Status: DC
  Administered 2013-03-01: 10 mg via ORAL
  Filled 2013-02-28: qty 1

## 2013-02-28 MED ORDER — ASPIRIN 325 MG PO TABS
325.0000 mg | ORAL_TABLET | ORAL | Status: AC
  Administered 2013-02-28: 325 mg via ORAL
  Filled 2013-02-28: qty 1
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
## 2013-02-28 NOTE — ED Provider Notes (Signed)
Medical screening examination/treatment/procedure(s) were conducted as a shared visit with non-physician practitioner(s) and myself.  I personally evaluated the patient during the encounter.  EKG Interpretation     Ventricular Rate:  90 PR Interval:  124 QRS Duration: 78 QT Interval:  353 QTC Calculation: 432 R Axis:   43 Text Interpretation:  Sinus rhythm Atrial premature complexes in couplets Abnormal R-wave progression, early transition Nonspecific repol abnormality, lateral leads Baseline wander in lead(s) V3 No significant change since last tracing           Patient seen and examined and describes substernal chest pain similar to his prior angina. His EKG does not show any signs of acute coronary syndrome. You have a chronic inpatient evaluation. Will consult hospitalist. He is currently pain-free  Toy Baker, MD 02/28/13 1203

## 2013-02-28 NOTE — H&P (Signed)
[facility] History and Physical  Kenneth Daugherty AVW:[id number] DOB: 01-13-1941 DOA: 02/28/2013  Referring physician:  PCP: No primary provider on file.  Specialists:   Chief Complaint: Chest Pain  HPI: Kenneth Daugherty is a 72 y.o. 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 Presents emergency department for chest pain. Patient says his chest pain started early this morning it was midsternal with no radiation. He states that it was a pushing type of pain. Complains of diaphoresis, shortness of breath, nausea and vomiting with this chest pain. Exam is approximately 20 minutes. Nothing relieved or made his pain worse. Patient did not take aspirin with this chest pain. Patient states that he believes he had MI last year and heartburn [place]. Patient does not follow up with a cardiologist. However his PCP is with [facility].  Currently patient is chest pain-free he Complains of shortness of breath or nausea. Patient does still admit to smoking one cigarette per day.  Review of Systems:  Constitutional: Denies fever, chills, appetite change and fatigue.  HEENT: Denies photophobia, eye pain, redness, hearing loss, ear pain, congestion, sore throat, rhinorrhea, sneezing, mouth sores, trouble swallowing, neck pain, neck stiffness and tinnitus.   Respiratory: Denies  DOE, cough, chest tightness,  and wheezing.   Cardiovascular: Denies palpitations and leg swelling.  Gastrointestinal: Denies abdominal pain, diarrhea, constipation, blood in stool and abdominal distention.  Genitourinary: Denies dysuria, urgency, frequency, hematuria, flank pain and difficulty urinating.  Musculoskeletal: Denies myalgias, back pain, joint swelling, arthralgias and gait problem.  Skin: Denies pallor, rash and wound.  Neurological: Denies dizziness, seizures, syncope, weakness, light-headedness, numbness and headaches.  Hematological: Denies adenopathy. Easy bruising,  personal or family bleeding history  Psychiatric/Behavioral: Denies suicidal ideation, mood changes, confusion, nervousness, sleep disturbance and agitation  Past Medical History  Diagnosis Date  . Hyperlipidemia   . Hypertension   . UTI (lower urinary tract infection)   . Anxiety    History reviewed. No pertinent past surgical history. Social History:  reports that he has been smoking.  He does not have any smokeless tobacco history on file. He reports that he does not drink alcohol or use illicit drugs. Patient lives alone and is able to conduct his daily activities.  No Known Allergies  Family History  Problem Relation Age of Onset  . Cancer Neg Hx   . Heart disease Neg Hx   . Hypertension Mother   . Hyperlipidemia Father    Prior to Admission medications   Medication Sig Start Date End Date Taking? Authorizing Provider  diphenhydrAMINE (BENADRYL) 25 mg capsule Take 25 mg by mouth every 6 (six) hours as needed for allergies.   Yes Historical Provider, MD  FLUoxetine (PROZAC) 10 MG capsule Take 10 mg by mouth daily.   Yes Historical Provider, MD  lisinopril (PRINIVIL,ZESTRIL) 10 MG tablet Take 10 mg by mouth daily.   Yes Historical Provider, MD  nitrofurantoin, macrocrystal-monohydrate, (MACROBID) 100 MG capsule Take 100 mg by mouth 3 (three) times daily.   Yes Historical Provider, MD  traZODone (DESYREL) 50 MG tablet Take 50 mg by mouth at bedtime.   Yes Historical Provider, MD   Physical Exam: Filed Vitals:   02/28/13 1423  BP: 167/92  Pulse: 79  Temp:   Resp: 14     General: Well developed, well nourished, NAD, appears stated age  HEENT: NCAT, PERRLA, EOMI, Anicteic Sclera, mucous membranes moist. No pharyngeal erythema or exudates  Neck: Supple, no JVD, no masses,  Cardiovascular: S1 S2 auscultated, no rubs, murmurs or gallops. Regular rate and rhythm.  Respiratory: Clear to auscultation bilaterally with equal chest rise  Abdomen: Soft, nontender,  nondistended, + bowel sounds  Extremities: warm dry without cyanosis clubbing or edema  Neuro: AAOx3, cranial nerves grossly intact. Strength 5/5 in patient's upper and lower extremities bilaterally  Skin: Without rashes exudates or nodules  Psych: Normal affect and demeanor with intact judgement and insight  Labs on Admission:  Basic Metabolic Panel:  Recent Labs Lab 02/24/13 1252 02/28/13 1035  NA 143 137  K 4.2 4.5  CL 104 99  CO2 28 24  GLUCOSE 108* 116*  BUN 9 9  CREATININE 0.98 0.99  CALCIUM 9.8 9.8   Liver Function Tests:  Recent Labs Lab 02/24/13 1252  AST 12  ALT 7  ALKPHOS 70  BILITOT 0.4  PROT 7.2  ALBUMIN 3.8   No results found for this basename: LIPASE, AMYLASE,  in the last 168 hours No results found for this basename: AMMONIA,  in the last 168 hours CBC:  Recent Labs Lab 02/24/13 1252 02/28/13 1035  WBC 4.5 5.7  HGB 14.4 15.2  HCT 42.2 43.8  MCV 83.9 83.9  PLT 208 218   Cardiac Enzymes: No results found for this basename: CKTOTAL, CKMB, CKMBINDEX, TROPONINI,  in the last 168 hours  BNP (last 3 results)  Recent Labs  02/28/13 1036  PROBNP 99.9   CBG: No results found for this basename: GLUCAP,  in the last 168 hours  Radiological Exams on Admission: Dg Chest 2 View  02/28/2013   CLINICAL DATA:  Chest pain, shortness of breath  EXAM: CHEST  2 VIEW  COMPARISON:  02/24/2013  FINDINGS: Mild blunting of the left costophrenic angle, possibly reflecting a trace left pleural effusion. No pneumothorax.  Nodular opacity overlying the right lower lung is favored to reflect a nipple shadow.  The heart is normal in size.  Mild degenerative changes of the visualized thoracolumbar spine.  IMPRESSION: Possible trace left pleural effusion.   Electronically Signed   By: Charline Bills M.D.   On: 02/28/2013 12:30    EKG: Independently reviewed. Sinus rhythm, rate 90, PACs, normal axis  Assessment/Plan Principal Problem:   Chest pain Active  Problems:   Hip pain, right   Hypertension   CAD (coronary artery disease)  Chest pain, rule out acute coronary syndrome Patient will be admitted to telemetry unit. Will continue to monitor his enzymes as well as EKGs. Will also obtain lipid panel, magnesium, TSH, phosphorus levels. Patient has a history of coronary disease. TIMI score of 2.  Will continue patient on lisinopril. Will add metoprolol and aspirin. EKG shows no ST changes as of now. First troponin was negative.  Spoke with Dr. Nahser,Cardiology, via phone, who stated patient as an outpatient. At this time is does not believe patient needs inpatient assessment.  Accelerated hypertension Patient was noted to have a blood pressure of 196/86. Will continue his home medications of lisinopril. Will on medications if needed.  Depression Continue fluoxetine.  DVT prophylaxis: Heparin  Code Status: Full  Condition: Guarded  Family Communication: None. Admission, patients condition and plan of care including tests being ordered have been discussed with the patient who indicates understanding and agrees with the plan and Code Status.  Disposition Plan: Admitted for observation.  Time spent: 45 minutes  Almer Bushey D.O. [facility] Pager [phone]  If 7PM-7AM, please contact night-coverage [email or web address] Password [facility] 02/28/2013, 2:34 PM

## 2013-02-28 NOTE — ED Notes (Signed)
Admitting MD at bedside.

## 2013-03-01 ENCOUNTER — Encounter: Payer: Self-pay | Admitting: Cardiovascular Disease

## 2013-03-01 DIAGNOSIS — I2510 Atherosclerotic heart disease of native coronary artery without angina pectoris: Secondary | ICD-10-CM

## 2013-03-01 DIAGNOSIS — M25559 Pain in unspecified hip: Secondary | ICD-10-CM

## 2013-03-01 LAB — TSH: TSH: 1.767 u[IU]/mL (ref 0.350–4.500)

## 2013-03-01 LAB — TROPONIN I: Troponin I: <0.3 ng/mL (ref <0.30)

## 2013-03-01 MED ORDER — ASPIRIN 81 MG PO TBEC: 81.0000 mg | DELAYED_RELEASE_TABLET | Freq: Every day | ORAL | Status: DC

## 2013-03-01 MED ORDER — NITROGLYCERIN 0.4 MG SL SUBL: 0.4000 mg | SUBLINGUAL_TABLET | SUBLINGUAL | Status: AC | PRN

## 2013-03-01 MED ORDER — METOPROLOL TARTRATE 25 MG PO TABS: 25.0000 mg | ORAL_TABLET | Freq: Two times a day (BID) | ORAL | Status: DC

## 2013-03-01 MED ORDER — TRAZODONE HCL 50 MG PO TABS: 50.0000 mg | ORAL_TABLET | Freq: Every day | ORAL | Status: DC

## 2013-03-01 MED ORDER — ATORVASTATIN CALCIUM 10 MG PO TABS: 10.0000 mg | ORAL_TABLET | Freq: Every day | ORAL | Status: AC

## 2013-03-01 MED ORDER — NITROFURANTOIN MONOHYD MACRO 100 MG PO CAPS: 100.0000 mg | ORAL_CAPSULE | Freq: Three times a day (TID) | ORAL | Status: AC

## 2013-03-01 MED ORDER — ATORVASTATIN CALCIUM 10 MG PO TABS: 10.0000 mg | ORAL_TABLET | Freq: Every day | ORAL | Status: DC

## 2013-03-01 MED ORDER — ASPIRIN 81 MG PO TBEC: 81.0000 mg | DELAYED_RELEASE_TABLET | Freq: Every day | ORAL | Status: AC

## 2013-03-01 MED ORDER — FLUOXETINE HCL 10 MG PO CAPS: 10.0000 mg | ORAL_CAPSULE | Freq: Every day | ORAL | Status: DC

## 2013-03-01 MED ORDER — TRAZODONE HCL 50 MG PO TABS: 50.0000 mg | ORAL_TABLET | Freq: Every day | ORAL | Status: AC

## 2013-03-01 MED ORDER — NITROFURANTOIN MONOHYD MACRO 100 MG PO CAPS: 100.0000 mg | ORAL_CAPSULE | Freq: Three times a day (TID) | ORAL | Status: DC

## 2013-03-01 MED ORDER — FLUOXETINE HCL 10 MG PO CAPS: 10.0000 mg | ORAL_CAPSULE | Freq: Every day | ORAL | Status: AC

## 2013-03-01 MED ORDER — NITROGLYCERIN 0.4 MG SL SUBL: 0.4000 mg | SUBLINGUAL_TABLET | SUBLINGUAL | Status: DC | PRN

## 2013-03-01 MED ORDER — ATORVASTATIN CALCIUM 10 MG PO TABS
10.0000 mg | ORAL_TABLET | Freq: Every day | ORAL | Status: DC
  Filled 2013-03-01: qty 1

## 2013-03-01 MED ORDER — LISINOPRIL 10 MG PO TABS: 10.0000 mg | ORAL_TABLET | Freq: Every day | ORAL | Status: DC

## 2013-03-01 MED ORDER — LISINOPRIL 10 MG PO TABS: 10.0000 mg | ORAL_TABLET | Freq: Every day | ORAL | Status: AC

## 2013-03-01 NOTE — ED Provider Notes (Signed)
Medical screening examination/treatment/procedure(s) were conducted as a shared visit with non-physician practitioner(s) and myself.  I personally evaluated the patient during the encounter.  EKG Interpretation     Ventricular Rate:  90 PR Interval:  124 QRS Duration: 78 QT Interval:  353 QTC Calculation: 432 R Axis:   43 Text Interpretation:  Sinus rhythm Atrial premature complexes in couplets Abnormal R-wave progression, early transition Nonspecific repol abnormality, lateral leads Baseline wander in lead(s) V3 No significant change since last tracing             Toy Baker, MD 03/01/13 1444
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
## 2013-03-01 NOTE — Progress Notes (Signed)
Utilization Review completed.  

## 2013-03-01 NOTE — Progress Notes (Signed)
CSW received consult for PCP needs, csw referred pt to RN CM who will assess pt current needs related to PCP.  Marland KitchenNo further Clinical Social Work needs, signing off.   Catha Gosselin, LCSW [phone]  ED CSW .03/01/2013 1022am

## 2013-03-02 ENCOUNTER — Telehealth: Payer: Self-pay | Admitting: *Deleted

## 2013-03-02 DIAGNOSIS — R079 Chest pain, unspecified: Secondary | ICD-10-CM

## 2013-03-02 DIAGNOSIS — R9431 Abnormal electrocardiogram [ECG] [EKG]: Secondary | ICD-10-CM
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
## 2013-03-02 NOTE — Progress Notes (Signed)
lexiscan ordered/ f/u to be arranged.

## 2013-03-02 NOTE — Telephone Encounter (Signed)
Called and spoke with pt and nephew to make them aware of the stress test order/ may need to f/u in [place].  Please call pt to schedule lexiscan/ 1-2 week f/u here or in [place].

## 2013-03-03 NOTE — Progress Notes (Signed)
Patient Discharge Instructions:  After Visit Summary (AVS):   Faxed to:  03/03/13 Discharge Summary Note:   Faxed to:  03/03/13 Psychiatric Admission Assessment Note:   Faxed to:  03/03/13 Suicide Risk Assessment - Discharge Assessment:   Faxed to:  03/03/13 Faxed/Sent to the Next Level Care provider:  03/03/13 Faxed to [facility] @ [phone]  Jerelene Redden, 03/03/2013, 3:35 PM

## 2013-03-24 ENCOUNTER — Ambulatory Visit (HOSPITAL_COMMUNITY): Payer: Medicare PPO | Attending: Cardiovascular Disease | Admitting: Radiology

## 2013-03-24 VITALS — BP 176/109 | HR 89 | Ht 67.0 in | Wt 170.0 lb

## 2013-03-24 DIAGNOSIS — R079 Chest pain, unspecified: Secondary | ICD-10-CM

## 2013-03-24 DIAGNOSIS — I252 Old myocardial infarction: Secondary | ICD-10-CM | POA: Insufficient documentation

## 2013-03-24 DIAGNOSIS — F17200 Nicotine dependence, unspecified, uncomplicated: Secondary | ICD-10-CM | POA: Insufficient documentation

## 2013-03-24 DIAGNOSIS — R0602 Shortness of breath: Secondary | ICD-10-CM | POA: Insufficient documentation

## 2013-03-24 DIAGNOSIS — E785 Hyperlipidemia, unspecified: Secondary | ICD-10-CM | POA: Insufficient documentation

## 2013-03-24 DIAGNOSIS — R42 Dizziness and giddiness: Secondary | ICD-10-CM | POA: Insufficient documentation

## 2013-03-24 DIAGNOSIS — R9431 Abnormal electrocardiogram [ECG] [EKG]: Secondary | ICD-10-CM | POA: Insufficient documentation

## 2013-03-24 DIAGNOSIS — I10 Essential (primary) hypertension: Secondary | ICD-10-CM | POA: Insufficient documentation

## 2013-03-24 DIAGNOSIS — J45909 Unspecified asthma, uncomplicated: Secondary | ICD-10-CM | POA: Insufficient documentation

## 2013-03-24 DIAGNOSIS — I2510 Atherosclerotic heart disease of native coronary artery without angina pectoris: Secondary | ICD-10-CM | POA: Insufficient documentation

## 2013-03-24 MED ORDER — TECHNETIUM TC 99M SESTAMIBI GENERIC - CARDIOLITE
30.0000 | Freq: Once | INTRAVENOUS | Status: AC | PRN
  Administered 2013-03-24: 30 via INTRAVENOUS

## 2013-03-24 MED ORDER — REGADENOSON 0.4 MG/5ML IV SOLN
0.4000 mg | Freq: Once | INTRAVENOUS | Status: AC
  Administered 2013-03-24: 0.4 mg via INTRAVENOUS

## 2013-03-24 MED ORDER — TECHNETIUM TC 99M SESTAMIBI GENERIC - CARDIOLITE
10.0000 | Freq: Once | INTRAVENOUS | Status: AC | PRN
  Administered 2013-03-24: 10 via INTRAVENOUS

## 2013-03-24 NOTE — Progress Notes (Signed)
[facility] SITE 3 NUCLEAR MED [address] [place], [place] [address] [phone]    Cardiology Nuclear Med Study  Kenneth Daugherty is a 72 y.o. male     MRN : [id number]     DOB: 1941/03/10  Procedure Date: 03/24/2013  Nuclear Med Background Indication for Stress Test:  Evaluation for Ischemia, Abnormal EKG, and Patient seen in hospital on 02-28-13 for chest pain, enzymes negative History:  CAD, MI, Asthma Cardiac Risk Factors: Hypertension, Lipids and Smoker  Symptoms:  Chest Pain (last episode was 2 weeks ago), Dizziness and SOB   Nuclear Pre-Procedure Caffeine/Decaff Intake:  5:00am one tablespoon decaffeinated coffee  NPO After: 5:00am   Lungs:  clear O2 Sat: 97% on room air. IV 0.9% NS with Angio Cath:  22g  IV Site: R Antecubital x 1, tolerated well IV Started by:  Irean Hong, RN  Chest Size (in):  40 Cup Size: n/a  Height: 5\' 7"  (1.702 m)  Weight:  170 lb (77.111 kg)  BMI:  Body mass index is 26.62 kg/(m^2). Tech Comments:  No medications today    Nuclear Med Study 1 or 2 day study: 1 day  Stress Test Type:  Lexiscan  Reading MD: Olga Millers, MD  Order Authorizing Provider:  Kristeen Miss, MD  Resting Radionuclide: Technetium 26m Sestamibi  Resting Radionuclide Dose: 10.8 mCi   Stress Radionuclide:  Technetium 95m Sestamibi  Stress Radionuclide Dose: 33.0 mCi           Stress Protocol Rest HR: 89 Stress HR: 105  Rest BP: 176/109 Stress BP: 183/89  Exercise Time (min): n/a METS: n/a           Dose of Adenosine (mg):  n/a Dose of Lexiscan: 0.4 mg  Dose of Atropine (mg): n/a Dose of Dobutamine: n/a mcg/kg/min (at max HR)  Stress Test Technologist: Nelson Chimes, BS-ES  Nuclear Technologist:  Domenic Polite, CNMT     Rest Procedure:  Myocardial perfusion imaging was performed at rest 45 minutes following the intravenous administration of Technetium 83m Sestamibi. Rest ECG: NSR, short PR, nonspecific ST changes.  Stress Procedure:  The  patient received IV Lexiscan 0.4 mg over 15-seconds.  Technetium 46m Sestamibi injected at 30-seconds.  Quantitative spect images were obtained after a 45 minute delay.  During the infusion of Lexiscan, patient complained of chest tightness, SOB and lightheadedness.  Symptoms began to resolve in recovery.  Stress ECG: No significant ST segment change suggestive of ischemia.  QPS Raw Data Images:  Acquisition technically good; normal left ventricular size. Stress Images:  Normal homogeneous uptake in all areas of the myocardium. Rest Images:  Normal homogeneous uptake in all areas of the myocardium. Subtraction (SDS):  No evidence of ischemia. Transient Ischemic Dilatation (Normal <1.22):  1.04 Lung/Heart Ratio (Normal <0.45):  0.28  Quantitative Gated Spect Images QGS EDV:  n/a ml QGS ESV:  n/a ml  Impression Exercise Capacity:  Lexiscan with no exercise. BP Response:  Normal blood pressure response. Clinical Symptoms:  There is chest pain and dyspnea. ECG Impression:  No significant ST segment change suggestive of ischemia. Comparison with Prior Nuclear Study: No images to compare  Overall Impression:  Normal stress nuclear study.  LV Ejection Fraction: Study not gated.  LV Wall Motion:  NA  Olga Millers

## 2013-03-27 ENCOUNTER — Encounter: Payer: Self-pay | Admitting: Physician Assistant

## 2013-03-27 ENCOUNTER — Ambulatory Visit (INDEPENDENT_AMBULATORY_CARE_PROVIDER_SITE_OTHER): Payer: Medicare PPO | Admitting: Physician Assistant

## 2013-03-27 VITALS — BP 128/52 | HR 75 | Ht 67.0 in | Wt 172.8 lb

## 2013-03-27 DIAGNOSIS — R0602 Shortness of breath: Secondary | ICD-10-CM

## 2013-03-27 DIAGNOSIS — I2510 Atherosclerotic heart disease of native coronary artery without angina pectoris: Secondary | ICD-10-CM

## 2013-03-27 DIAGNOSIS — I509 Heart failure, unspecified: Secondary | ICD-10-CM

## 2013-03-27 DIAGNOSIS — R079 Chest pain, unspecified: Secondary | ICD-10-CM

## 2013-03-27 DIAGNOSIS — E785 Hyperlipidemia, unspecified: Secondary | ICD-10-CM

## 2013-03-27 DIAGNOSIS — I10 Essential (primary) hypertension: Secondary | ICD-10-CM
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
## 2013-03-27 NOTE — Patient Instructions (Addendum)
Your physician recommends that you continue on your current medications as directed. Please refer to the Current Medication list given to you today.  PLEASE SCHEDULE FOR AN ECHO; DX 786.05, 428.0  PLEASE FOLLOW UP WITH DR. Elease Hashimoto IN 3 WEEKS  LAB WORK TO BE DONE IN 6 WEEKS; YOU WILL NEED TO BE FASTING SO NOTHING TO EAT OR DRINK AFTER MIDNIGHT THE NIGHT BEFORE LABS  PLEASE REQUEST RECORDS FROM THE HOSPITAL AND DOCTOR IN [place], [place] TO BE FAXED TO [place], [place] [phone]

## 2013-04-14 ENCOUNTER — Ambulatory Visit (HOSPITAL_COMMUNITY): Payer: Medicare PPO | Attending: Cardiovascular Disease | Admitting: Cardiology

## 2013-04-14 ENCOUNTER — Encounter: Payer: Self-pay | Admitting: Cardiology

## 2013-04-14 DIAGNOSIS — R072 Precordial pain: Secondary | ICD-10-CM | POA: Insufficient documentation

## 2013-04-14 DIAGNOSIS — R079 Chest pain, unspecified: Secondary | ICD-10-CM

## 2013-04-14 DIAGNOSIS — I509 Heart failure, unspecified: Secondary | ICD-10-CM

## 2013-04-14 DIAGNOSIS — E785 Hyperlipidemia, unspecified: Secondary | ICD-10-CM | POA: Insufficient documentation

## 2013-04-14 DIAGNOSIS — I10 Essential (primary) hypertension: Secondary | ICD-10-CM | POA: Insufficient documentation

## 2013-04-14 DIAGNOSIS — F17200 Nicotine dependence, unspecified, uncomplicated: Secondary | ICD-10-CM | POA: Insufficient documentation

## 2013-04-14 DIAGNOSIS — I359 Nonrheumatic aortic valve disorder, unspecified: Secondary | ICD-10-CM | POA: Insufficient documentation

## 2013-04-14 DIAGNOSIS — R0602 Shortness of breath: Secondary | ICD-10-CM

## 2013-04-14 DIAGNOSIS — I2510 Atherosclerotic heart disease of native coronary artery without angina pectoris: Secondary | ICD-10-CM | POA: Insufficient documentation

## 2013-04-14 NOTE — Progress Notes (Signed)
Echo performed. 

## 2013-04-17 ENCOUNTER — Telehealth: Payer: Self-pay | Admitting: *Deleted

## 2013-04-17 ENCOUNTER — Encounter: Payer: Self-pay | Admitting: Physician Assistant

## 2013-04-17 NOTE — Telephone Encounter (Signed)
Notes Recorded by Baird Lyons, RN on 04/17/2013 at 2:38 PM [email or web address]  ------  Notes Recorded by Beatrice Lecher, PA-C on 04/17/2013 at 1:40 PM Echo ok with Normal LV function. Tereso Newcomer, PA-C 04/17/2013 1:40 PM

## 2013-04-20 NOTE — Telephone Encounter (Signed)
Pt aware of Echo results by phone.  

## 2013-05-08 ENCOUNTER — Other Ambulatory Visit (INDEPENDENT_AMBULATORY_CARE_PROVIDER_SITE_OTHER): Payer: Medicare PPO

## 2013-05-08 DIAGNOSIS — I509 Heart failure, unspecified: Secondary | ICD-10-CM

## 2013-05-08 LAB — HEPATIC FUNCTION PANEL
ALT: 12 U/L (ref 0–53)
AST: 8 U/L (ref 0–37)
Albumin: 3.7 g/dL (ref 3.5–5.2)
Alkaline Phosphatase: 78 U/L (ref 39–117)
BILIRUBIN DIRECT: 0 mg/dL (ref 0.0–0.3)
BILIRUBIN TOTAL: 0.4 mg/dL (ref 0.3–1.2)
Total Protein: 6.8 g/dL (ref 6.0–8.3)

## 2013-05-08 LAB — LIPID PANEL
CHOL/HDL RATIO: 3
Cholesterol: 144 mg/dL (ref 0–200)
HDL: 45.1 mg/dL (ref >39.00)
LDL CALC: 85 mg/dL (ref 0–99)
Triglycerides: 72 mg/dL (ref 0.0–149.0)
VLDL: 14.4 mg/dL (ref 0.0–40.0)

## 2013-05-08 LAB — BASIC METABOLIC PANEL
BUN: 12 mg/dL (ref 6–23)
CHLORIDE: 102 meq/L (ref 96–112)
CO2: 30 meq/L (ref 19–32)
CREATININE: 1.3 mg/dL (ref 0.4–1.5)
Calcium: 9.4 mg/dL (ref 8.4–10.5)
GFR: 72.45 mL/min (ref >60.00)
Glucose, Bld: 88 mg/dL (ref 70–99)
Potassium: 4.6 mEq/L (ref 3.5–5.1)
Sodium: 139 mEq/L (ref 135–145)

## 2013-07-14 ENCOUNTER — Encounter: Payer: Self-pay | Admitting: Cardiovascular Disease

## 2013-07-14 ENCOUNTER — Ambulatory Visit (INDEPENDENT_AMBULATORY_CARE_PROVIDER_SITE_OTHER): Payer: Medicare PPO | Admitting: Cardiovascular Disease

## 2013-07-14 VITALS — BP 150/84 | HR 90 | Ht 67.0 in | Wt 164.8 lb

## 2013-07-14 DIAGNOSIS — I2510 Atherosclerotic heart disease of native coronary artery without angina pectoris: Secondary | ICD-10-CM

## 2013-07-14 DIAGNOSIS — I10 Essential (primary) hypertension: Secondary | ICD-10-CM

## 2013-07-14 MED ORDER — METOPROLOL TARTRATE 50 MG PO TABS: 50.0000 mg | ORAL_TABLET | Freq: Two times a day (BID) | ORAL | Status: AC

## 2013-07-14 NOTE — Assessment & Plan Note (Addendum)
His blood pressure and heart rate or a little elevated. We'll increase his metoprolol to 50 mg twice a day. I'll see him in 3 months for followup visit.  His echo shows normal LV function.

## 2013-07-14 NOTE — Assessment & Plan Note (Signed)
He Reports a history of heart attack in the past but a Myoview study reveals no ischemia and/or infarct. His left ventricular function is normal. Fortunately he is not having any symptoms.

## 2013-07-14 NOTE — Patient Instructions (Signed)
Your physician has recommended you make the following change in your medication:  INCREASE METOPROLOL TO 50 MG TWICE DAILY 12 HOURS APART.  Your physician recommends that you schedule a follow-up appointment in: 3 MONTHS
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
## 2013-09-30 ENCOUNTER — Ambulatory Visit: Payer: Self-pay | Admitting: Cardiovascular Disease

## 2013-11-10 ENCOUNTER — Encounter: Payer: Self-pay | Admitting: Cardiovascular Disease

## 2015-04-16 IMAGING — CR DG CHEST 2V
2 series · 2 of 2 positions shown · non-contrast
Comparison: None.

CLINICAL DATA: Shortness of breath, dizziness

EXAM:
CHEST  2 VIEW

[w chest pa]
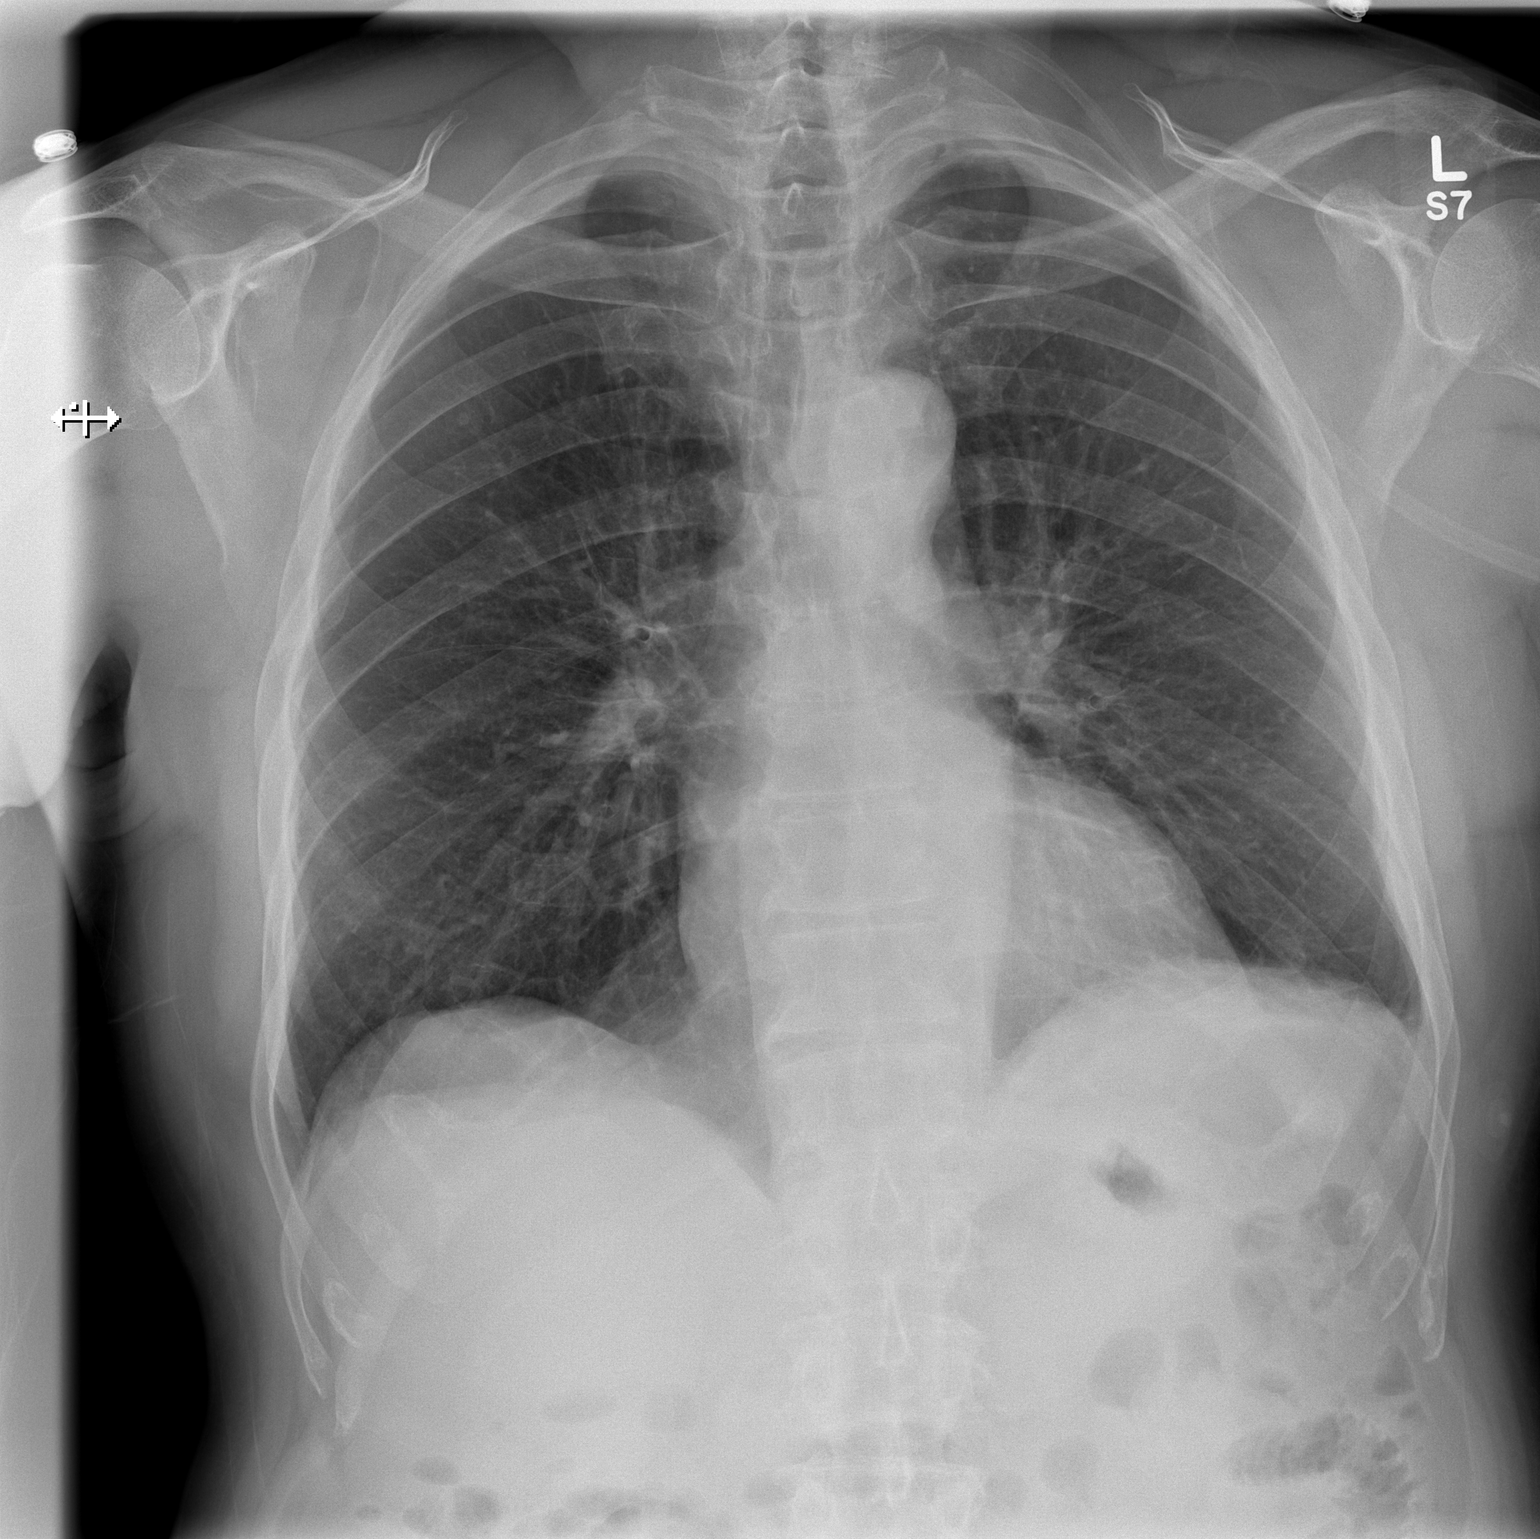

[w chest lat]
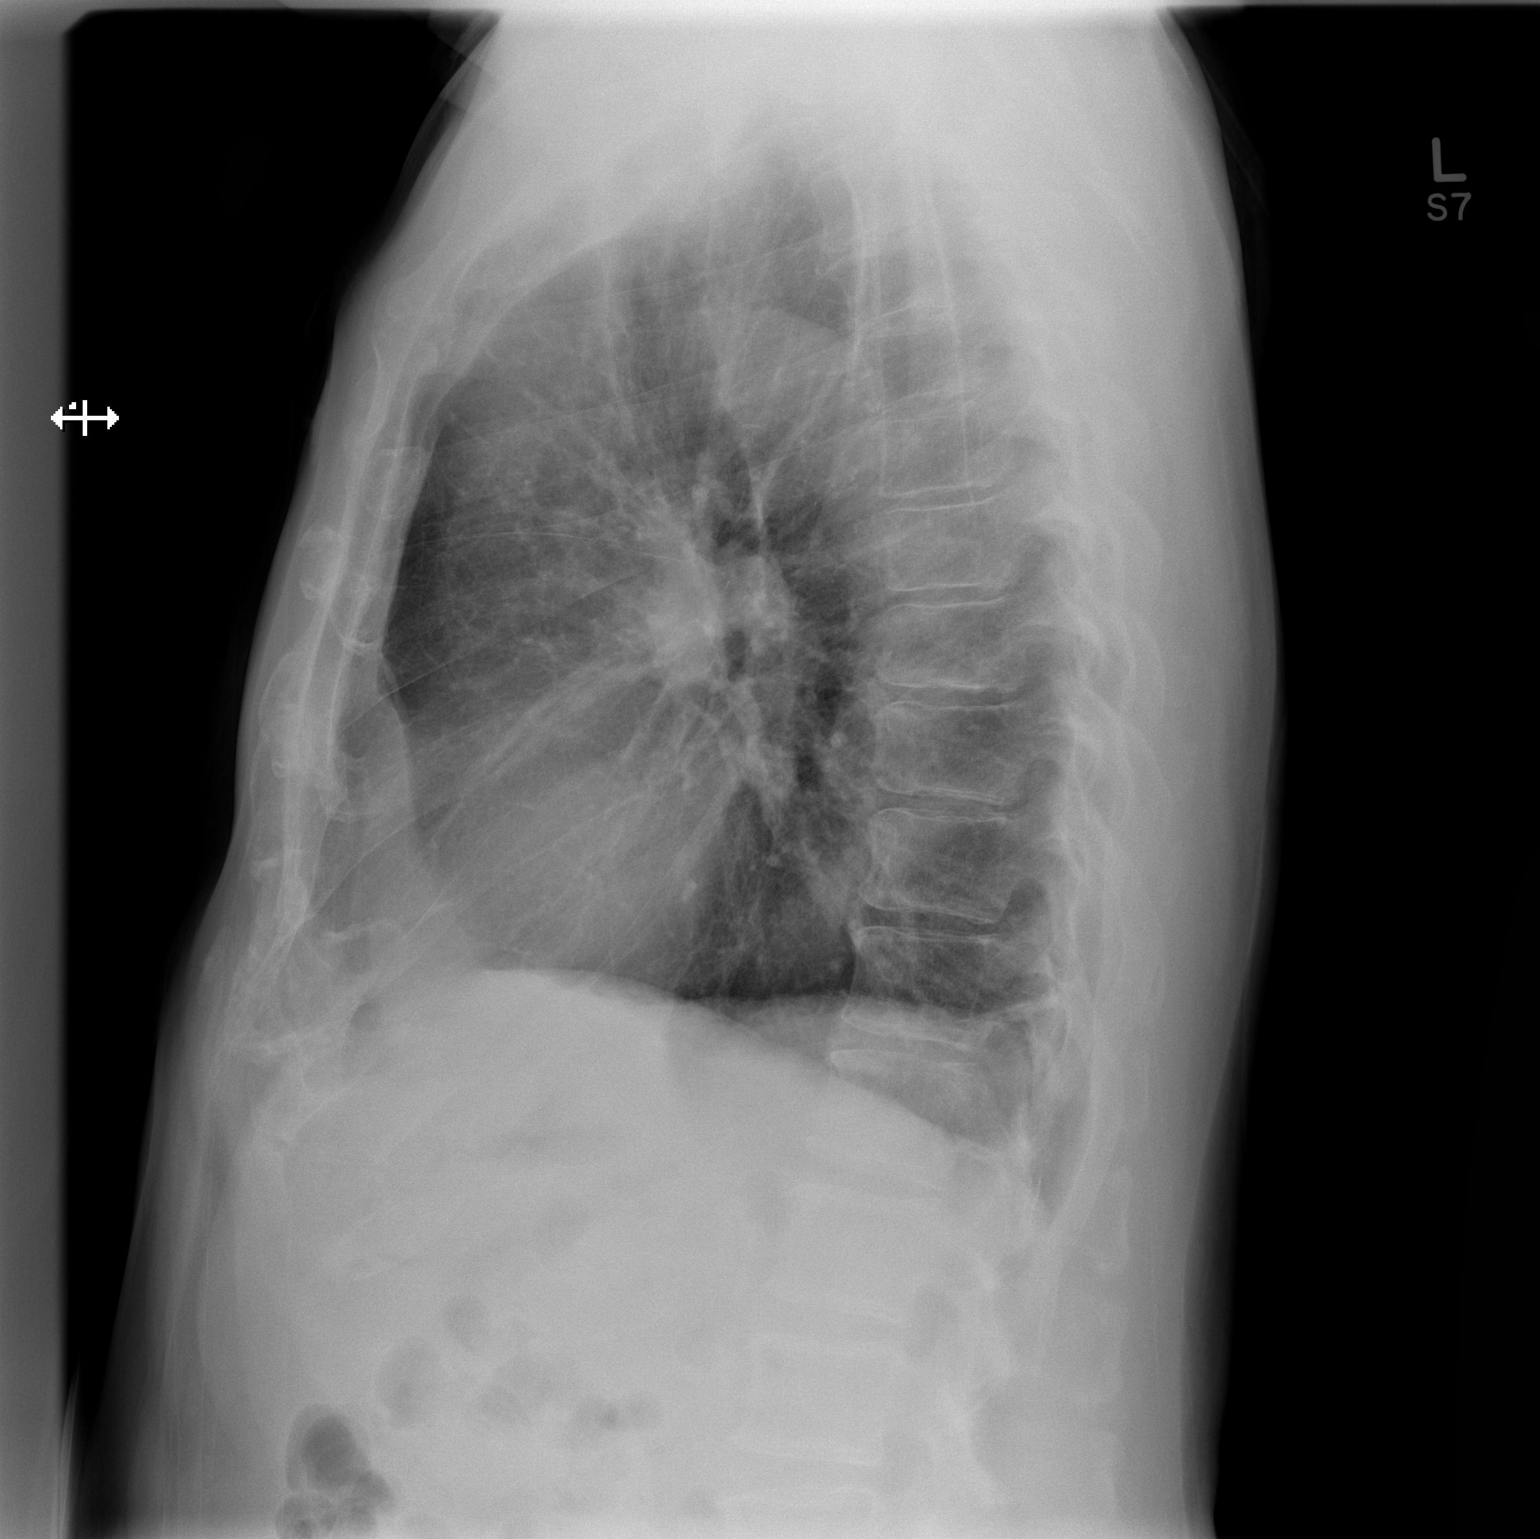

[2 of 2 positions shown; findings below may reference images not displayed]

FINDINGS: The heart size and mediastinal contours are within normal limits.
Both lungs are clear. The visualized skeletal structures are
unremarkable.
IMPRESSION: No active cardiopulmonary disease.

## 2015-04-20 IMAGING — CR DG CHEST 2V
2 series · 2 of 2 positions shown · non-contrast
Comparison: 02/24/2013

CLINICAL DATA: Chest pain, shortness of breath

EXAM:
CHEST  2 VIEW

[w chest pa]
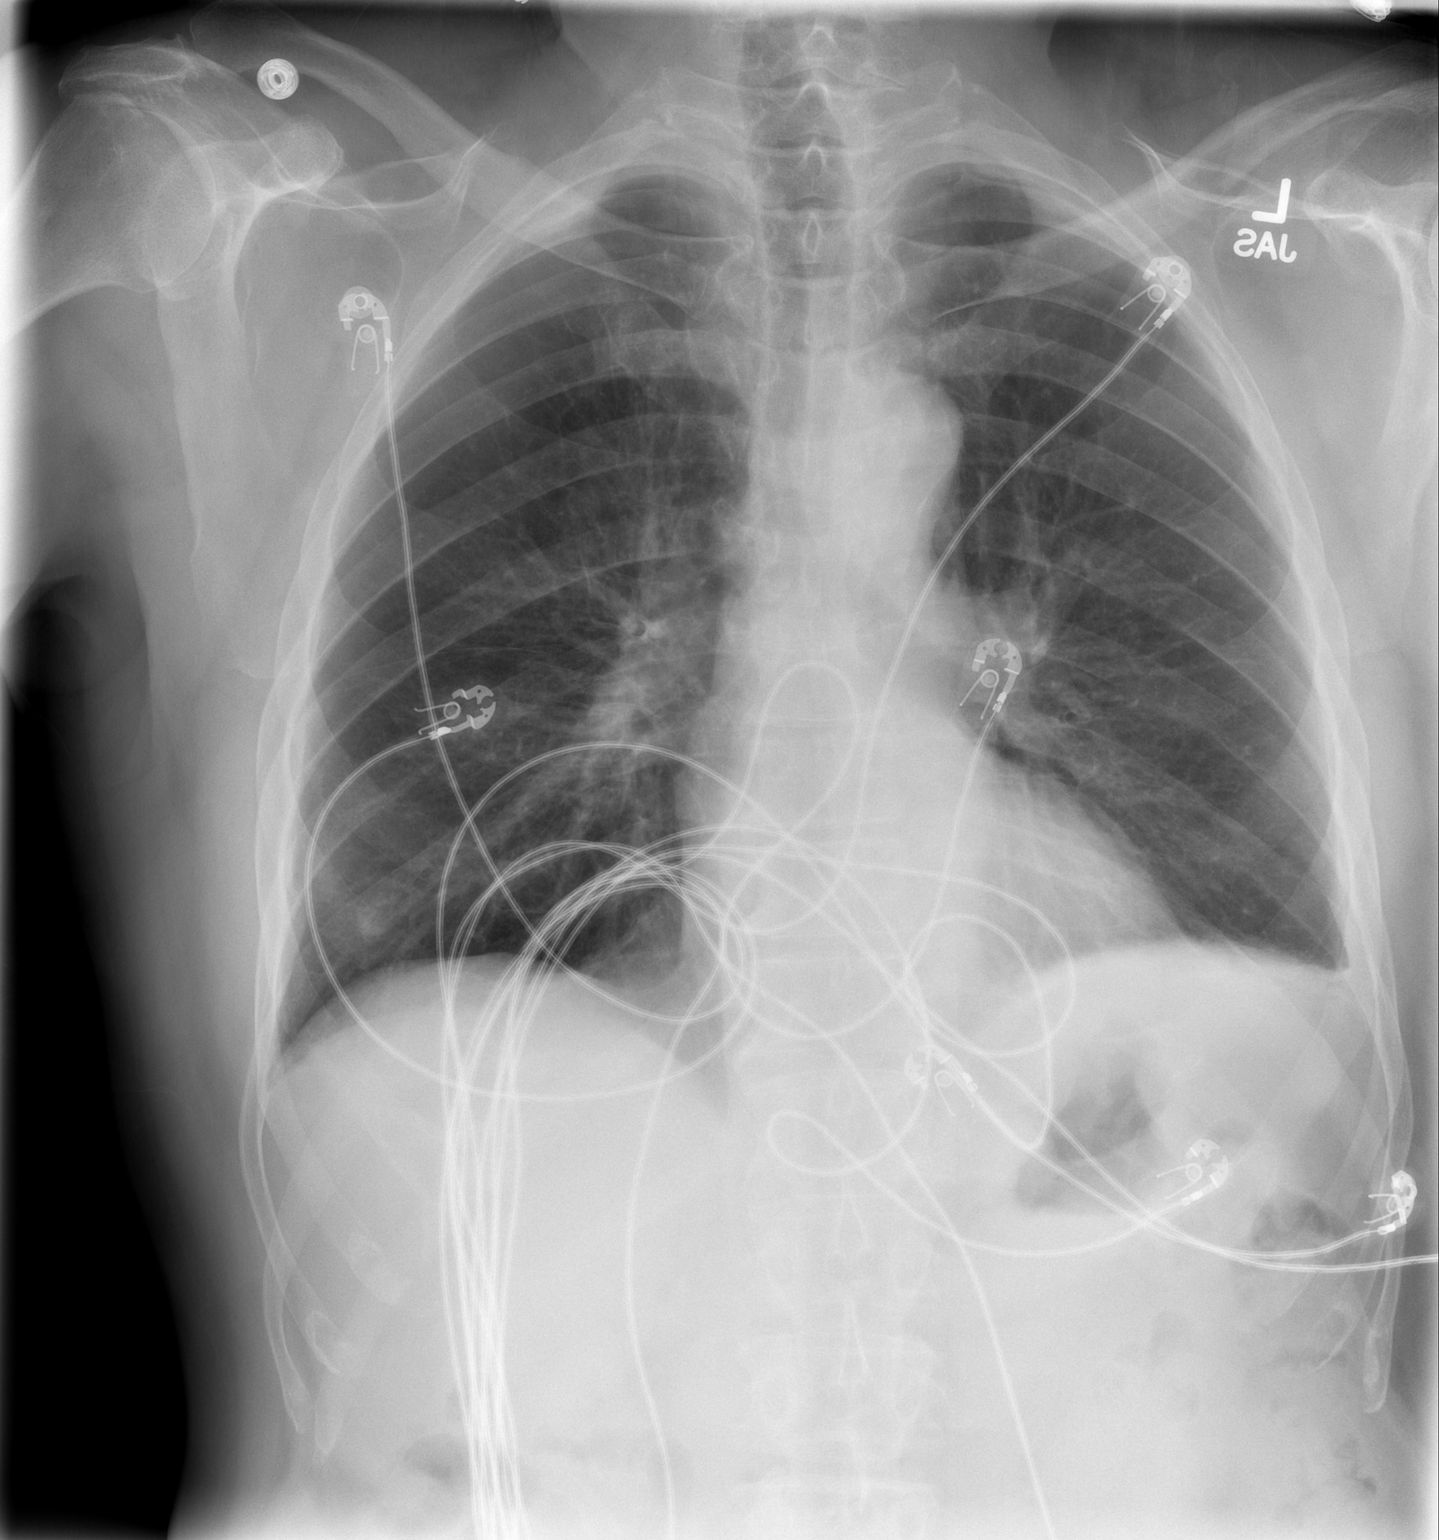

[w chest lat]
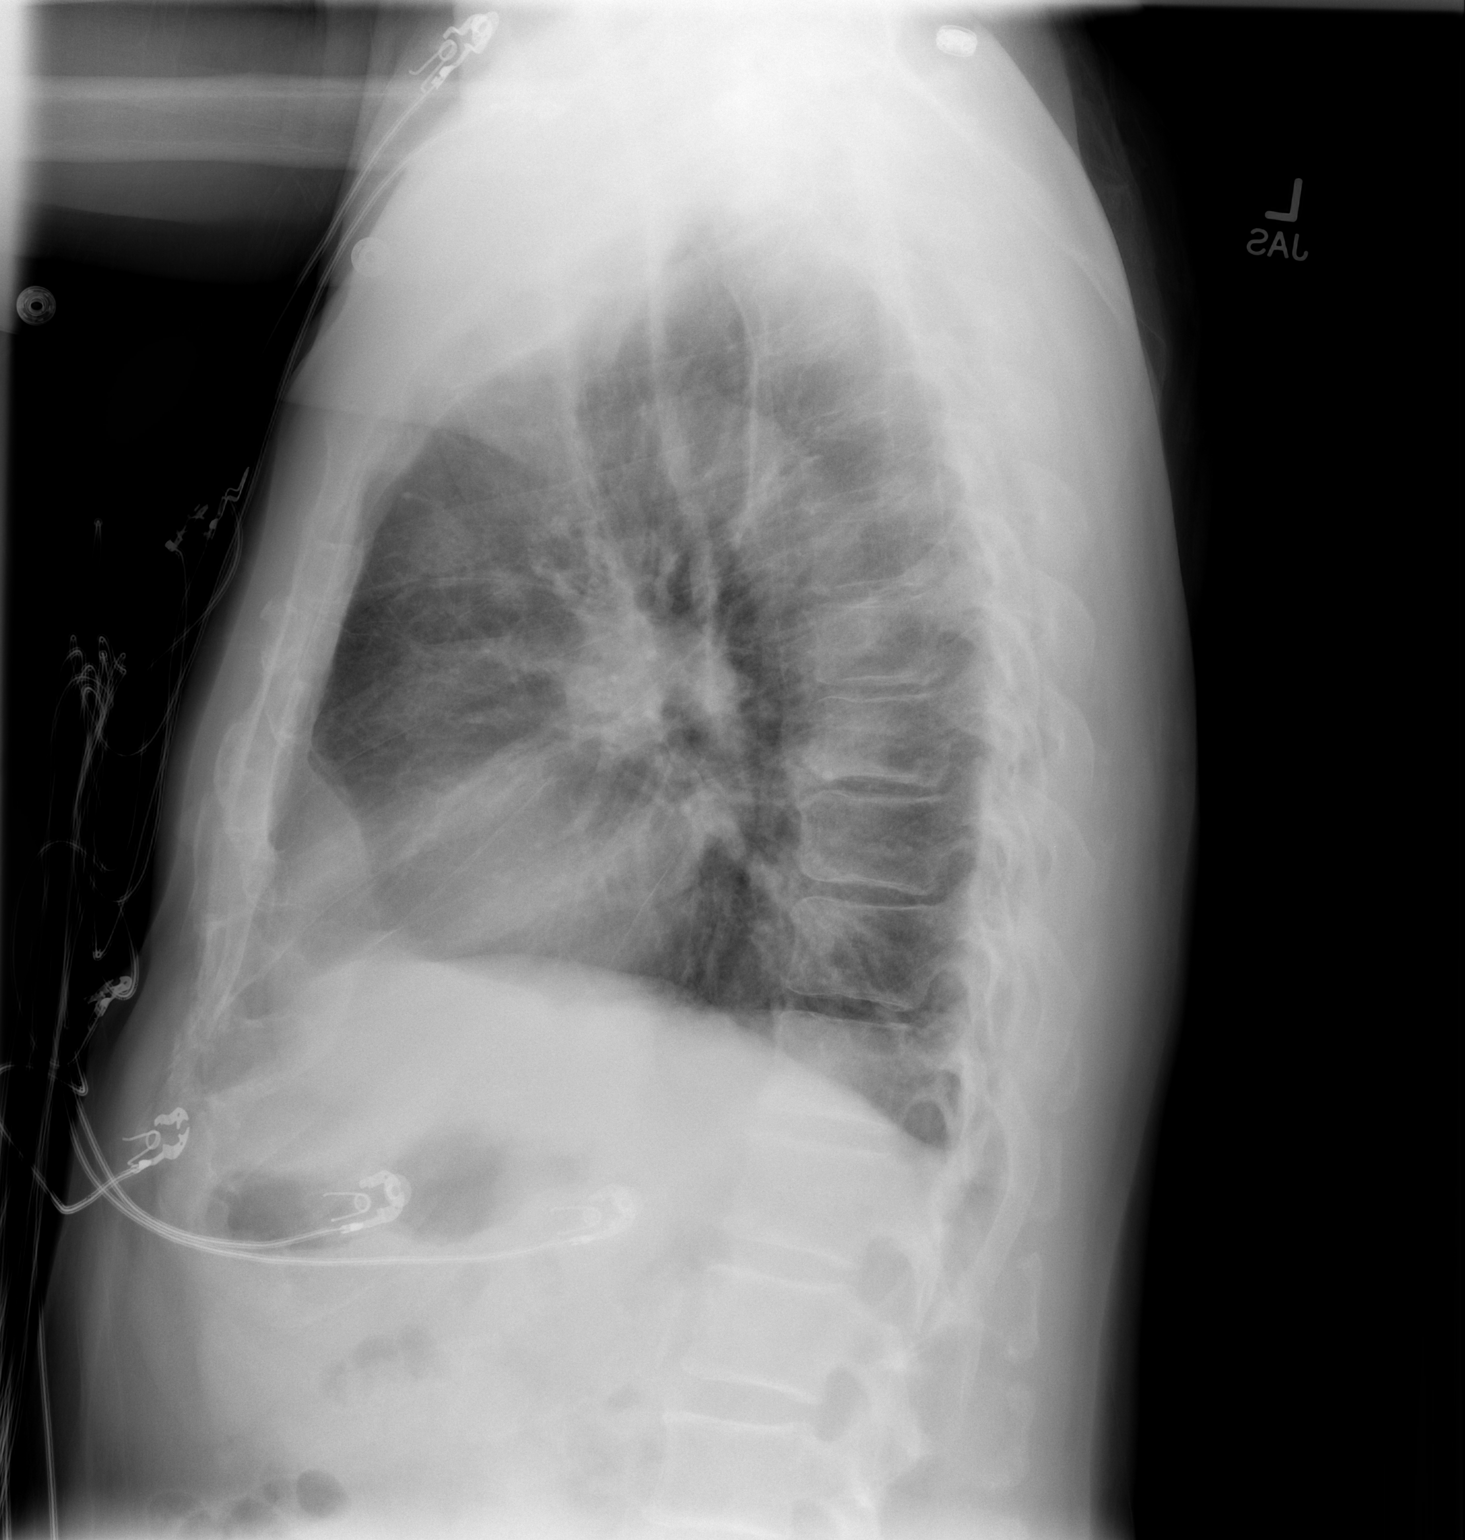

[2 of 2 positions shown; findings below may reference images not displayed]

FINDINGS: Mild blunting of the left costophrenic angle, possibly reflecting a
trace left pleural effusion. No pneumothorax.

Nodular opacity overlying the right lower lung is favored to reflect
a nipple shadow.

The heart is normal in size.

Mild degenerative changes of the visualized thoracolumbar spine.
IMPRESSION: Possible trace left pleural effusion.

## 2022-01-21 DEATH — deceased
